# Patient Record
Sex: Female | Born: 1955 | Race: White | Hispanic: No | Smoking: Never smoker
Health system: Southern US, Community
[De-identification: ages and names within clinical notes are randomized; demographics above are authoritative.]

## PROBLEM LIST (undated history)

## (undated) ENCOUNTER — Emergency Department (HOSPITAL_BASED_OUTPATIENT_CLINIC_OR_DEPARTMENT_OTHER): Payer: Medicare Other

## (undated) DIAGNOSIS — E079 Disorder of thyroid, unspecified: Secondary | ICD-10-CM

## (undated) DIAGNOSIS — J189 Pneumonia, unspecified organism: Secondary | ICD-10-CM

## (undated) DIAGNOSIS — K219 Gastro-esophageal reflux disease without esophagitis: Secondary | ICD-10-CM

## (undated) DIAGNOSIS — K859 Acute pancreatitis without necrosis or infection, unspecified: Secondary | ICD-10-CM

## (undated) HISTORY — DX: Disorder of thyroid, unspecified: E07.9

## (undated) HISTORY — PX: COLONOSCOPY: SHX174

## (undated) HISTORY — DX: Pneumonia, unspecified organism: J18.9

## (undated) HISTORY — DX: Gastro-esophageal reflux disease without esophagitis: K21.9

## (undated) HISTORY — DX: Acute pancreatitis without necrosis or infection, unspecified: K85.90

---

## 1995-03-14 HISTORY — PX: STAPEDECTOMY: SHX2435

## 2005-08-17 ENCOUNTER — Encounter: Admission: RE | Admit: 2005-08-17 | Discharge: 2005-08-17 | Payer: Self-pay | Admitting: Nephrology

## 2010-01-10 ENCOUNTER — Encounter (HOSPITAL_COMMUNITY)
Admission: RE | Admit: 2010-01-10 | Discharge: 2010-03-12 | Payer: Self-pay | Source: Home / Self Care | Attending: Internal Medicine | Admitting: Internal Medicine

## 2010-04-03 ENCOUNTER — Encounter: Payer: Self-pay | Admitting: Nephrology

## 2010-06-01 ENCOUNTER — Ambulatory Visit (HOSPITAL_BASED_OUTPATIENT_CLINIC_OR_DEPARTMENT_OTHER)
Admission: RE | Admit: 2010-06-01 | Discharge: 2010-06-01 | Disposition: A | Discharge status: Home / Self Care | Payer: Medicare Other | Source: Ambulatory Visit | Attending: Orthopedic Surgery | Admitting: Orthopedic Surgery

## 2010-06-01 DIAGNOSIS — S60459A Superficial foreign body of unspecified finger, initial encounter: Secondary | ICD-10-CM | POA: Insufficient documentation

## 2010-06-01 DIAGNOSIS — Y92009 Unspecified place in unspecified non-institutional (private) residence as the place of occurrence of the external cause: Secondary | ICD-10-CM | POA: Insufficient documentation

## 2010-06-01 DIAGNOSIS — IMO0002 Reserved for concepts with insufficient information to code with codable children: Secondary | ICD-10-CM | POA: Insufficient documentation

## 2010-06-10 NOTE — Op Note (Signed)
NAMEJOHANN, [name]                    ACCOUNT NO.:  [id number]  MEDICAL RECORD NO.:  [id number]           PATIENT TYPE:  LOCATION:                                 FACILITY:  PHYSICIAN:  Betha Loa, MD             DATE OF BIRTH:  DATE OF PROCEDURE:  06/01/2010 DATE OF DISCHARGE:                              OPERATIVE REPORT   PREOPERATIVE DIAGNOSIS:  Left ring finger retained foreign body.  POSTOPERATIVE DIAGNOSIS:  Left ring finger retained foreign body.  PROCEDURE:  Removal of foreign body, left ring finger.  SURGEON:  Betha Loa, MD  ASSISTANT:  None.  ANESTHESIA:  Digital block with 1.5% plain Carbocaine.  COMPLICATIONS:  None.  SPECIMENS:  None.  DISPOSITION:  Stable.  INDICATIONS:  Cristina [name] is a 55 year old right-hand-dominant female who one and half weeks ago was doing work in the garden and felt a piece of something going to the pad of her left ring finger.  Her daughter tried to remove it but was unsuccessful.  She presented to her primary care physician yesterday.  She was started on antibiotics.  She is referred to me for removal of the foreign body.  I saw her this morning.  She noted tenderness in the fingertip over where she felt the foreign body was.  She had no fever, chills, or night sweats.  There is no erythema, no swelling in the pad.  We discussed the nature of foreign bodies. This was nonradiopaque on radiographs.  She wished to have it removed. Risks, benefits, and alternatives to procedure were discussed including risk of blood loss, infection, damage to nerves, vessels, tendons, ligaments, and bone, failure of procedure, need for additional procedures, complications with wound healing, continued pain, and retained foreign body.  She voiced understanding these risks and elected to proceed.  PROCEDURE NOTE:  She was identified preoperatively by myself in the preoperative holding.  Risks, benefits, and alternatives of procedure were discussed and  she wished to proceed.  She was transferred to minor procedure room and placed on the table in supine position with left upper extremity on arm board.  Digital block was performed with 1.5% plain Carbocaine.  The was adequate to give total digital anesthesia. The hand was prepped in normal sterile orthopedic fashion.  Surgical pause was performed between surgeons and operating room staff prior to digital block.  All were in agreement as to the patient, procedure, and site of procedure.  Penrose drain was used as a tourniquet was up for less than 10 minutes.  A small incision was made at the volar pad of the finger over the area where she felt the foreign body.  Spreading technique was used.  Foreign body was easily identified in the subcutaneous tissues.  It appeared to be a small thorn.  This was removed in its entirety.  The area was explored.  No other foreign bodies were noted.  There was no purulence, no evidence of infection. The wound was copiously irrigated with 300 mL of sterile saline.  The wound was then closed with  5-0 nylon in a horizontal mattress fashion. It was dressed with sterile Xeroform and 2 x 2 gauze and wrapped with Kling and Coban dressing.  The Penrose drain was removed.  The patient tolerated the procedure well.  She was discharged in stable condition. I gave her Percocet 5/325 one to two p.o. q.6 h p.r.n. pain, dispensed #40.  She will see me back in the office in 1 week for postprocedure follow up.  If she has any problems prior to that, she will let me know.     Betha Loa, MD     KK/MEDQ  D:  06/01/2010  T:  06/02/2010  Job:  [id number]  Electronically Signed by Betha Loa  on 06/10/2010 02:26:39 PM

## 2013-10-29 ENCOUNTER — Other Ambulatory Visit: Payer: Self-pay | Admitting: Gastroenterology

## 2013-10-29 DIAGNOSIS — R1032 Left lower quadrant pain: Secondary | ICD-10-CM

## 2013-10-30 ENCOUNTER — Encounter (INDEPENDENT_AMBULATORY_CARE_PROVIDER_SITE_OTHER): Payer: Self-pay

## 2013-10-30 ENCOUNTER — Ambulatory Visit
Admission: RE | Admit: 2013-10-30 | Discharge: 2013-10-30 | Disposition: A | Discharge status: Home / Self Care | Payer: Medicare Other | Source: Ambulatory Visit | Attending: Gastroenterology | Admitting: Gastroenterology

## 2013-10-30 DIAGNOSIS — R1032 Left lower quadrant pain: Secondary | ICD-10-CM

## 2013-10-30 MED ORDER — IOHEXOL 300 MG/ML  SOLN
100.0000 mL | Freq: Once | INTRAMUSCULAR | Status: AC | PRN
  Administered 2013-10-30: 100 mL via INTRAVENOUS

## 2014-03-13 HISTORY — PX: COLON SURGERY: SHX602

## 2015-10-19 ENCOUNTER — Other Ambulatory Visit: Payer: Self-pay | Admitting: Orthopedic Surgery

## 2015-10-19 DIAGNOSIS — M25561 Pain in right knee: Secondary | ICD-10-CM

## 2015-10-20 ENCOUNTER — Ambulatory Visit
Admission: RE | Admit: 2015-10-20 | Discharge: 2015-10-20 | Disposition: A | Discharge status: Home / Self Care | Payer: Medicare Other | Source: Ambulatory Visit | Attending: Orthopedic Surgery | Admitting: Orthopedic Surgery

## 2015-10-20 DIAGNOSIS — M25561 Pain in right knee: Secondary | ICD-10-CM

## 2016-08-29 ENCOUNTER — Other Ambulatory Visit: Payer: Self-pay | Admitting: Orthopedic Surgery

## 2016-08-29 DIAGNOSIS — M25561 Pain in right knee: Principal | ICD-10-CM

## 2016-08-29 DIAGNOSIS — G8929 Other chronic pain: Secondary | ICD-10-CM

## 2016-09-05 ENCOUNTER — Ambulatory Visit
Admission: RE | Admit: 2016-09-05 | Discharge: 2016-09-05 | Disposition: A | Discharge status: Home / Self Care | Payer: Medicare Other | Source: Ambulatory Visit | Attending: Orthopedic Surgery | Admitting: Orthopedic Surgery

## 2016-09-05 DIAGNOSIS — M25561 Pain in right knee: Principal | ICD-10-CM

## 2016-09-05 DIAGNOSIS — G8929 Other chronic pain: Secondary | ICD-10-CM

## 2016-09-05 MED ORDER — IOPAMIDOL (ISOVUE-M 200) INJECTION 41%
30.0000 mL | Freq: Once | INTRAMUSCULAR | Status: AC
  Administered 2016-09-05: 30 mL via INTRA_ARTICULAR

## 2016-10-19 ENCOUNTER — Other Ambulatory Visit: Payer: Self-pay | Admitting: Nephrology

## 2016-10-19 ENCOUNTER — Ambulatory Visit
Admission: RE | Admit: 2016-10-19 | Discharge: 2016-10-19 | Disposition: A | Discharge status: Home / Self Care | Payer: Medicare Other | Source: Ambulatory Visit | Attending: Nephrology | Admitting: Nephrology

## 2016-10-19 DIAGNOSIS — N281 Cyst of kidney, acquired: Secondary | ICD-10-CM

## 2019-11-28 ENCOUNTER — Other Ambulatory Visit: Payer: Self-pay | Admitting: Nephrology

## 2019-11-28 DIAGNOSIS — N281 Cyst of kidney, acquired: Secondary | ICD-10-CM

## 2019-12-04 ENCOUNTER — Ambulatory Visit
Admission: RE | Admit: 2019-12-04 | Discharge: 2019-12-04 | Disposition: A | Discharge status: Home / Self Care | Payer: Medicare Other | Source: Ambulatory Visit | Attending: Nephrology | Admitting: Nephrology

## 2019-12-04 ENCOUNTER — Other Ambulatory Visit: Payer: Self-pay

## 2019-12-04 DIAGNOSIS — N281 Cyst of kidney, acquired: Secondary | ICD-10-CM

## 2019-12-04 MED ORDER — IOPAMIDOL (ISOVUE-300) INJECTION 61%
100.0000 mL | Freq: Once | INTRAVENOUS | Status: AC | PRN
  Administered 2019-12-04: 100 mL via INTRAVENOUS

## 2020-10-26 IMAGING — CT CT ABD-PEL WO/W CM
3 of 7 series · 13 of 32 positions shown, 18 images · IV contrast (APPLIED)
Comparison: Renal ultrasound, 10/19/2016, CT abdomen pelvis,
10/30/2013

CLINICAL DATA: Follow-up renal mass

EXAM:
CT ABDOMEN AND PELVIS WITHOUT AND WITH CONTRAST
TECHNIQUE: Multidetector CT imaging of the abdomen and pelvis was performed
following the standard protocol before and following the bolus
administration of intravenous contrast.
CONTRAST:  100mL AZMZOT-0LL IOPAMIDOL (AZMZOT-0LL) INJECTION 61%

[Series 2: abd w/(date) · axial · 0.64mm/px · z∈[-211,-103]mm · 3 of 74 slices shown]
[im 19/74  soft-tissue]
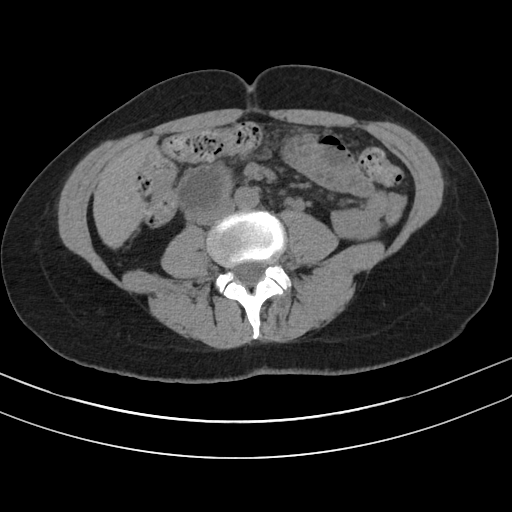
[im 37/74  soft-tissue]
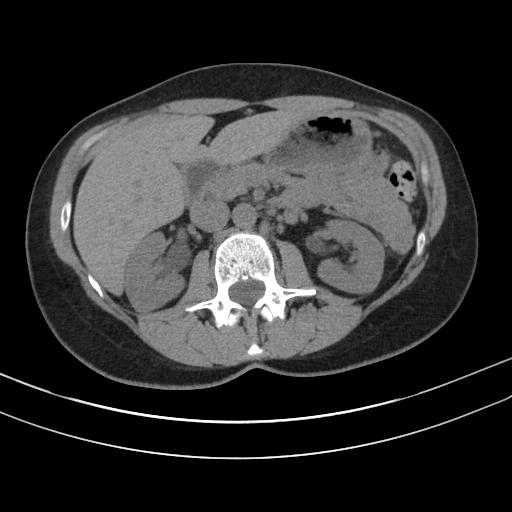
[im 55/74  soft-tissue]
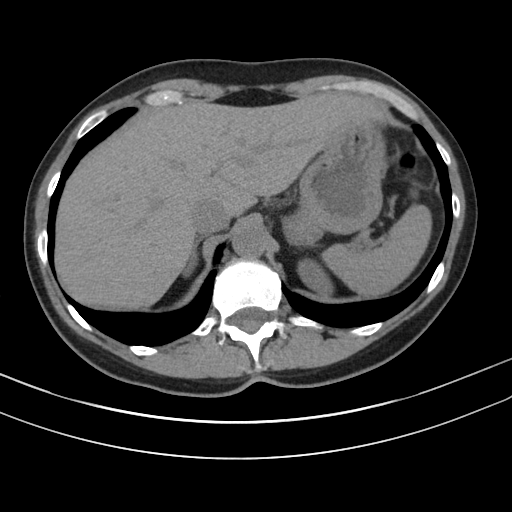

[Series 3: arterial · axial · arterial · 0.64mm/px · z∈[-211,-157]mm · 2 of 74 slices shown]
[im 19/74  soft-tissue]
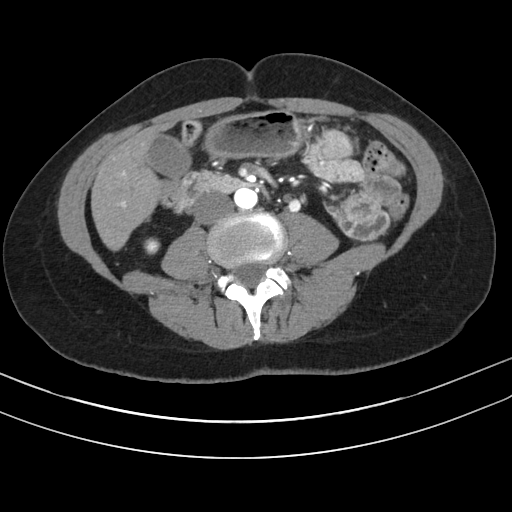
[im 37/74  soft-tissue]
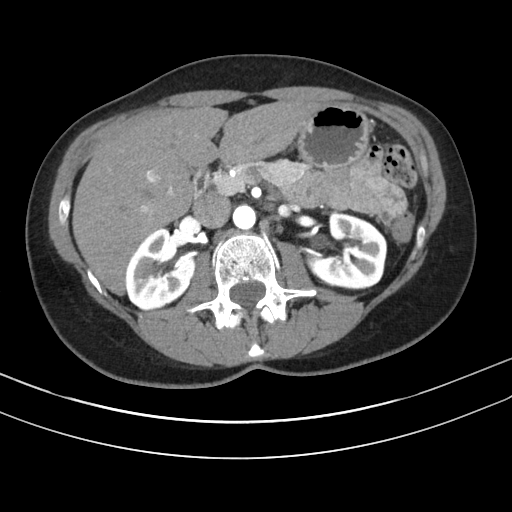

[Series 5: nephrographic · axial · 0.81mm/px · z∈[-433,-97]mm · 8 of 146 slices shown, 13 images]
[im 17/146  soft-tissue]
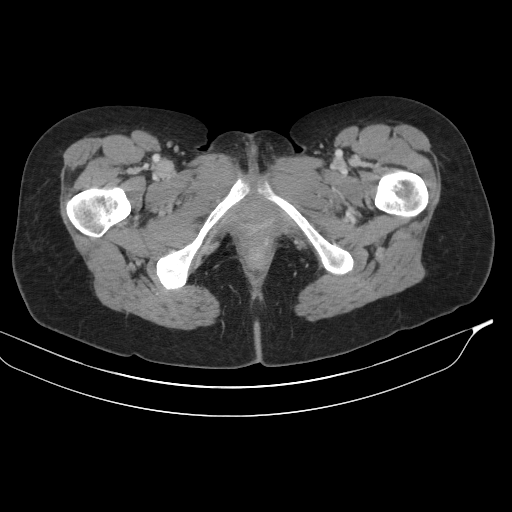
[im 17/146  bone]
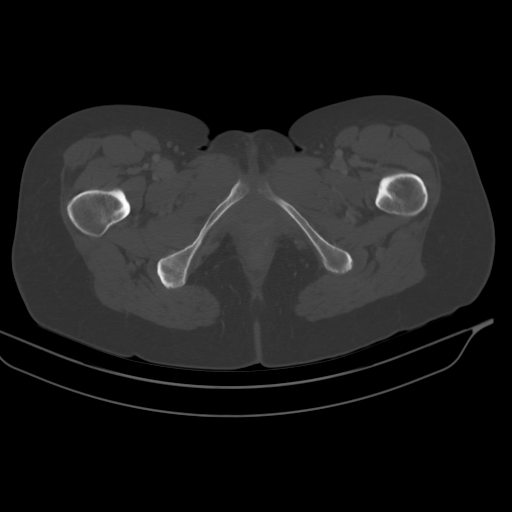
[im 33/146  soft-tissue]
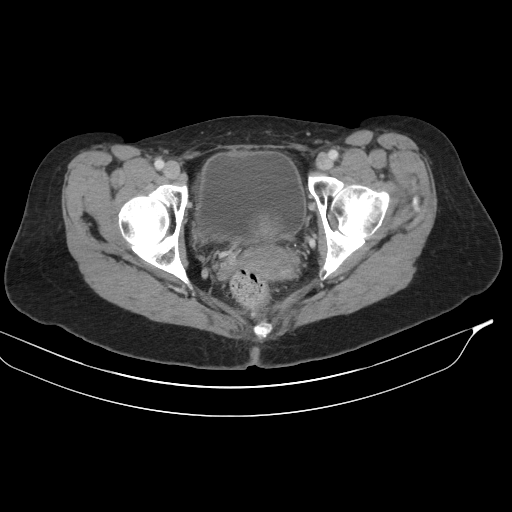
[im 49/146  soft-tissue]
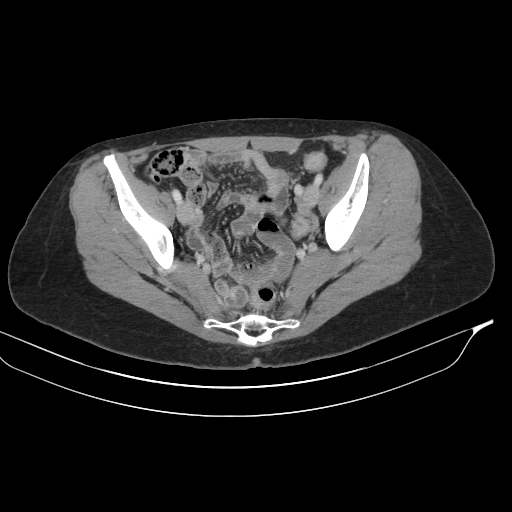
[im 65/146  soft-tissue]
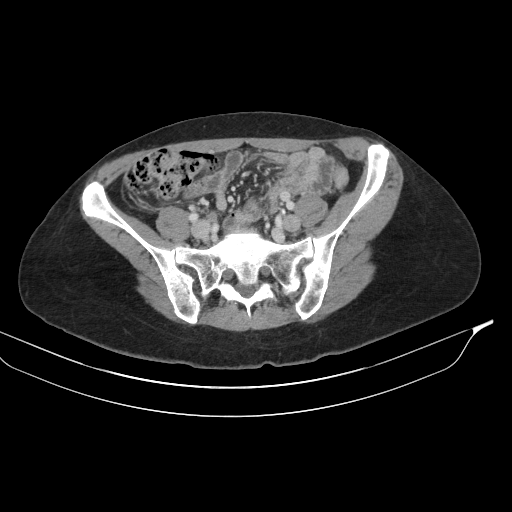
[im 81/146  soft-tissue]
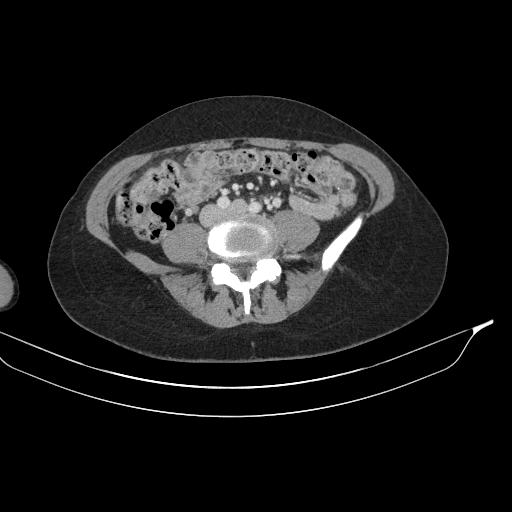
[im 81/146  lung]
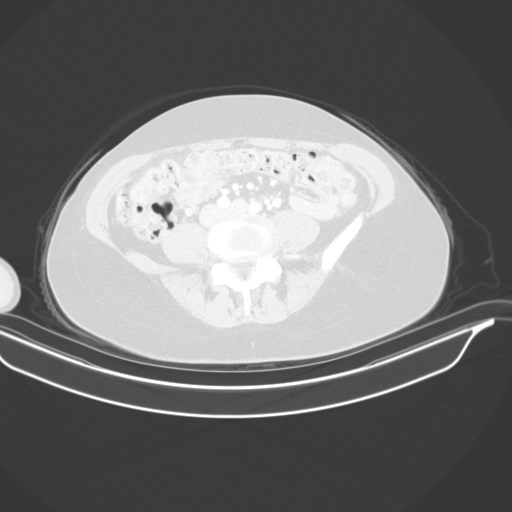
[im 97/146  soft-tissue]
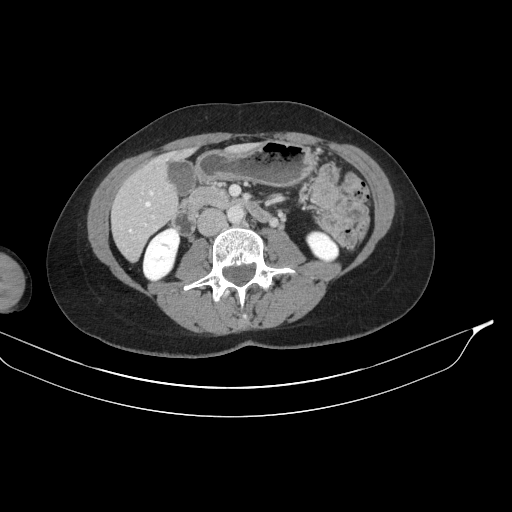
[im 97/146  lung]
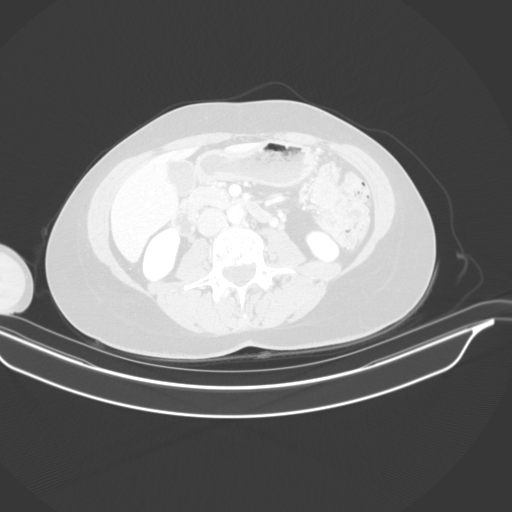
[im 113/146  soft-tissue]
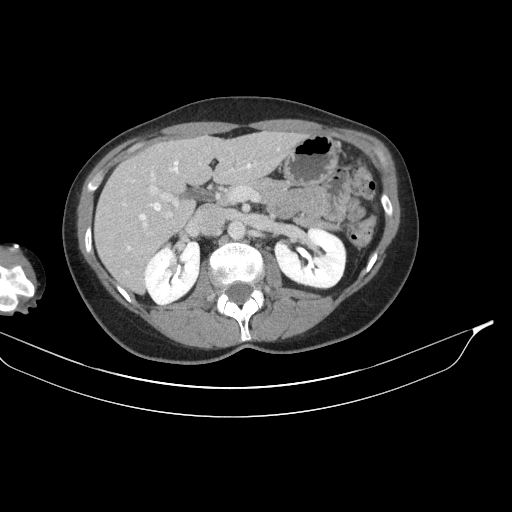
[im 113/146  lung]
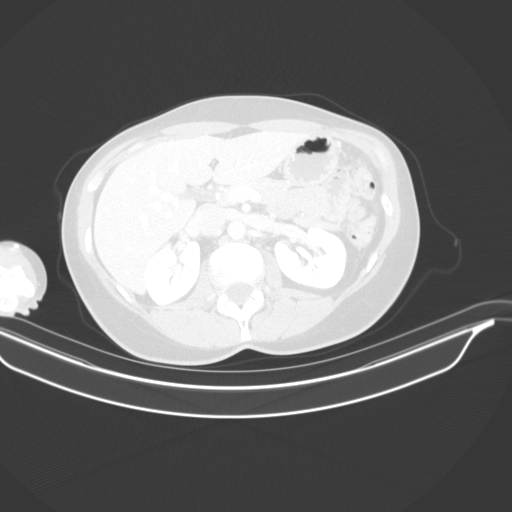
[im 129/146  soft-tissue]
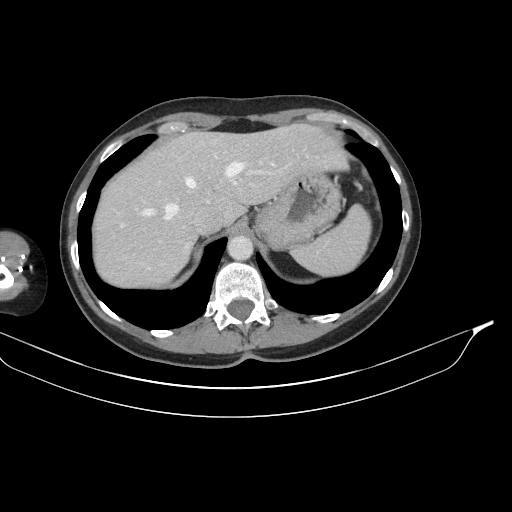
[im 129/146  lung]
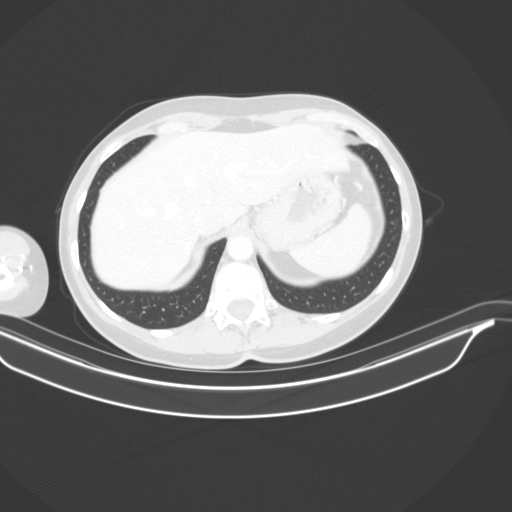

[13 of 32 positions shown; findings below may reference images not displayed]

FINDINGS: Lower chest: No acute abnormality.

Hepatobiliary: No solid liver abnormality is seen. No gallstones,
gallbladder wall thickening, or biliary dilatation.

Pancreas: Unremarkable. No pancreatic ductal dilatation or
surrounding inflammatory changes.

Spleen: Normal in size without significant abnormality.

Adrenals/Urinary Tract: Adrenal glands are unremarkable. There is an
unchanged, partially exophytic macroscopic fat containing mass of
the peripheral superior pole of the right kidney measuring 1.1 x
cm, not significantly changed compared to prior examinations (series
3, image 26). Kidneys are normal, without renal calculi, suspicious
solid lesion, or hydronephrosis. Bladder is unremarkable.

Stomach/Bowel: Stomach is within normal limits. Appendix appears
normal. No evidence of bowel wall thickening, distention, or
inflammatory changes.

Vascular/Lymphatic: No significant vascular findings are present. No
enlarged abdominal or pelvic lymph nodes.

Reproductive: There are large bilateral uterine and adnexal varices
with enlargement of the left ovarian vein, measuring up to 0.9 cm
near the confluence with the left renal vein.

Other: No abdominal wall hernia or abnormality. No abdominopelvic
ascites.

Musculoskeletal: No acute or significant osseous findings.
IMPRESSION: 1. There is an unchanged, partially exophytic macroscopic fat
containing mass of the peripheral superior pole of the right kidney
measuring 1.1 x 1.0 cm, not significantly changed compared to prior
examinations. This is consistent with a benign angiomyolipoma. No
further routine follow-up is required.
2. There are large bilateral uterine and adnexal varices with
enlargement of the left ovarian vein, measuring up to 0.9 cm near
the confluence with the left renal vein. Findings are consistent
with pelvic congestion if clinically referable signs and symptoms
are present.

## 2021-06-22 ENCOUNTER — Other Ambulatory Visit (HOSPITAL_BASED_OUTPATIENT_CLINIC_OR_DEPARTMENT_OTHER): Payer: Self-pay | Admitting: Family Medicine

## 2021-06-22 DIAGNOSIS — R1012 Left upper quadrant pain: Secondary | ICD-10-CM

## 2021-06-23 ENCOUNTER — Other Ambulatory Visit: Payer: Self-pay

## 2021-06-23 ENCOUNTER — Emergency Department (HOSPITAL_BASED_OUTPATIENT_CLINIC_OR_DEPARTMENT_OTHER)
Admission: RE | Admit: 2021-06-23 | Discharge: 2021-06-23 | Disposition: A | Discharge status: Home / Self Care | Payer: Medicare Other | Source: Ambulatory Visit | Attending: Family Medicine | Admitting: Family Medicine

## 2021-06-23 ENCOUNTER — Encounter (HOSPITAL_BASED_OUTPATIENT_CLINIC_OR_DEPARTMENT_OTHER): Payer: Self-pay

## 2021-06-23 ENCOUNTER — Emergency Department (HOSPITAL_BASED_OUTPATIENT_CLINIC_OR_DEPARTMENT_OTHER)
Admission: EM | Admit: 2021-06-23 | Discharge: 2021-06-23 | Disposition: A | Discharge status: Home / Self Care | Payer: Medicare Other | Attending: Emergency Medicine | Admitting: Emergency Medicine

## 2021-06-23 ENCOUNTER — Emergency Department (HOSPITAL_BASED_OUTPATIENT_CLINIC_OR_DEPARTMENT_OTHER): Payer: Medicare Other | Admitting: Radiology

## 2021-06-23 ENCOUNTER — Encounter (HOSPITAL_BASED_OUTPATIENT_CLINIC_OR_DEPARTMENT_OTHER): Payer: Self-pay | Admitting: Emergency Medicine

## 2021-06-23 DIAGNOSIS — R748 Abnormal levels of other serum enzymes: Secondary | ICD-10-CM | POA: Insufficient documentation

## 2021-06-23 DIAGNOSIS — R1012 Left upper quadrant pain: Secondary | ICD-10-CM

## 2021-06-23 DIAGNOSIS — K859 Acute pancreatitis without necrosis or infection, unspecified: Secondary | ICD-10-CM | POA: Diagnosis not present

## 2021-06-23 DIAGNOSIS — E039 Hypothyroidism, unspecified: Secondary | ICD-10-CM | POA: Insufficient documentation

## 2021-06-23 LAB — CBC
HCT: 40.1 % (ref 36.0–46.0)
Hemoglobin: 13.2 g/dL (ref 12.0–15.0)
MCH: 30.9 pg (ref 26.0–34.0)
MCHC: 32.9 g/dL (ref 30.0–36.0)
MCV: 93.9 fL (ref 80.0–100.0)
Platelets: 274 10*3/uL (ref 150–400)
RBC: 4.27 MIL/uL (ref 3.87–5.11)
RDW: 12.1 % (ref 11.5–15.5)
WBC: 6.9 10*3/uL (ref 4.0–10.5)
nRBC: 0 % (ref 0.0–0.2)

## 2021-06-23 LAB — BASIC METABOLIC PANEL
Anion gap: 7 (ref 5–15)
BUN: 7 mg/dL — ABNORMAL LOW (ref 8–23)
CO2: 30 mmol/L (ref 22–32)
Calcium: 10 mg/dL (ref 8.9–10.3)
Chloride: 101 mmol/L (ref 98–111)
Creatinine, Ser: 0.87 mg/dL (ref 0.44–1.00)
GFR, Estimated: >60 mL/min (ref >60)
Glucose, Bld: 93 mg/dL (ref 70–99)
Potassium: 3.9 mmol/L (ref 3.5–5.1)
Sodium: 138 mmol/L (ref 135–145)

## 2021-06-23 LAB — TROPONIN I (HIGH SENSITIVITY): Troponin I (High Sensitivity): <2 ng/L (ref <18)

## 2021-06-23 LAB — LIPASE, BLOOD: Lipase: 53 U/L — ABNORMAL HIGH (ref 11–51)

## 2021-06-23 MED ORDER — OXYCODONE-ACETAMINOPHEN 5-325 MG PO TABS
1.0000 | ORAL_TABLET | Freq: Four times a day (QID) | ORAL | 0 refills | Status: DC | PRN
Start: 2021-06-23 — End: 2021-06-23

## 2021-06-23 MED ORDER — SODIUM CHLORIDE 0.9 % IV BOLUS
1000.0000 mL | Freq: Once | INTRAVENOUS | Status: AC
Start: 2021-06-23 — End: 2021-06-23
  Administered 2021-06-23: 1000 mL via INTRAVENOUS

## 2021-06-23 MED ORDER — OXYCODONE-ACETAMINOPHEN 5-325 MG PO TABS
1.0000 | ORAL_TABLET | Freq: Four times a day (QID) | ORAL | 0 refills | Status: DC | PRN
Start: 2021-06-23 — End: 2022-11-08

## 2021-06-23 MED ORDER — IOHEXOL 300 MG/ML  SOLN
100.0000 mL | Freq: Once | INTRAMUSCULAR | Status: AC | PRN
  Administered 2021-06-23: 80 mL via INTRAVENOUS

## 2021-06-23 NOTE — Discharge Instructions (Addendum)
You are seen in the emergency department today for abdominal pain and concern for pancreatitis. ? ?As we discussed your lipase level is already coming down.  Your CT scan had showed concerning findings for possibly pancreatitis versus inflammation of your intestines.  With an elevated lipase result, we will treat this as though it is pancreatitis.  I think that you will require some further investigation as to the underlying etiology of the symptoms. ? ?We normally treat pancreatitis with pain medication and bowel rest.  Like you to continue doing clear fluids for at least the next 24 to 48 hours, and then he can move to full liquids after that.  From there you can transition to a bland diet as tolerated. ? ?Continue to monitor how you're doing and return to the ER for new or worsening symptoms.  ?

## 2021-06-23 NOTE — ED Notes (Signed)
Pt without complaint. Pt stated she is just waiting on lipase lab to result. ?

## 2021-06-23 NOTE — ED Provider Notes (Signed)
?[facility] ?Provider Note ? ? ?CSN: [id number] ?Arrival date & time: 06/23/21  1352 ? ?  ? ?History ? ?Chief Complaint  ?Patient presents with  ? Pancreatitis  ? ? ?Cristina [name] is a 66 y.o. female who presents emergency department with left upper quadrant pain for several days.  Patient initially thought that her symptoms are related to gas, and then went to her primary care doctor and had an elevated lipase level, so she was sent for CT scan.  The CT scan showed possible pancreatitis, and she was referred to the emergency department.  She states she was given Zofran, took that last night and her nausea subsided.  Patient reports overall her pain has improved, but she still sometimes has pain in the left upper quadrant with inspiration.  No shortness of breath or cough.  There is been taking in fluids, but has not felt up to eating. ? ?HPI ? ?  ? ?Home Medications ?Prior to Admission medications   ?Medication Sig Start Date End Date Taking? Authorizing Provider  ?oxyCODONE-acetaminophen (PERCOCET/ROXICET) 5-325 MG tablet Take 1 tablet by mouth every 6 (six) hours as needed for severe pain. 06/23/21  Yes Jeevan Kalla T, PA-C  ?   ? ?Allergies    ?Epinephrine and Lidocaine   ? ?Review of Systems   ?Review of Systems  ?Constitutional:  Negative for fever.  ?Respiratory:  Negative for shortness of breath.   ?Cardiovascular:  Negative for chest pain.  ?Gastrointestinal:  Positive for abdominal pain and nausea. Negative for blood in stool, constipation, diarrhea and vomiting.  ?Genitourinary:  Negative for dysuria and flank pain.  ?All other systems reviewed and are negative. ? ?Physical Exam ?Updated Vital Signs ?BP 106/61 (BP Location: Right Arm)   Pulse 76   Temp 98.3 ?F (36.8 ?C) (Oral)   Resp 18   Ht 5\' 5"  (1.651 m)   Wt 59 kg   SpO2 100%   BMI 21.63 kg/m?  ?Physical Exam ?Vitals and nursing note reviewed.  ?Constitutional:   ?   Appearance: Normal appearance.  ?HENT:  ?   Head:  Normocephalic and atraumatic.  ?Eyes:  ?   Conjunctiva/sclera: Conjunctivae normal.  ?Cardiovascular:  ?   Rate and Rhythm: Normal rate and regular rhythm.  ?Pulmonary:  ?   Effort: Pulmonary effort is normal. No respiratory distress.  ?   Breath sounds: Normal breath sounds.  ?Abdominal:  ?   General: There is no distension.  ?   Palpations: Abdomen is soft.  ?   Tenderness: There is abdominal tenderness in the epigastric area and left upper quadrant. There is no right CVA tenderness, left CVA tenderness, guarding or rebound.  ?Skin: ?   General: Skin is warm and dry.  ?Neurological:  ?   General: No focal deficit present.  ?   Mental Status: She is alert.  ? ? ?ED Results / Procedures / Treatments   ?Labs ?(all labs ordered are listed, but only abnormal results are displayed) ?Labs Reviewed  ?BASIC METABOLIC PANEL - Abnormal; Notable for the following components:  ?    Result Value  ? BUN 7 (*)   ? All other components within normal limits  ?LIPASE, BLOOD - Abnormal; Notable for the following components:  ? Lipase 53 (*)   ? All other components within normal limits  ?CBC  ?TROPONIN I (HIGH SENSITIVITY)  ? ? ?EKG ?None ? ?Radiology ?CT ABDOMEN PELVIS W CONTRAST ? ?Result Date: 06/23/2021 ?CLINICAL DATA:  Left upper  quadrant pain EXAM: CT ABDOMEN AND PELVIS WITH CONTRAST TECHNIQUE: Multidetector CT imaging of the abdomen and pelvis was performed using the standard protocol following bolus administration of intravenous contrast. RADIATION DOSE REDUCTION: This exam was performed according to the departmental dose-optimization program which includes automated exposure control, adjustment of the mA and/or kV according to patient size and/or use of iterative reconstruction technique. CONTRAST:  48mL OMNIPAQUE IOHEXOL 300 MG/ML  SOLN COMPARISON:  12/04/2019 FINDINGS: Lower chest: No acute abnormality. Hepatobiliary: Lateral right inferior liver subcapsular hyperenhancing focus compatible with a small hemangioma as before,  measuring 9 mm, image 22/2. No other significant hepatic abnormality. No other hepatic abnormality or biliary dilatation pattern. Gallbladder unremarkable. Common bile duct nondilated. Hepatic and portal veins are patent. Pancreas: In the left upper quadrant, about the pancreas tail, splenic hilum and splenic flexure of the colon there is minimal strandy edema in the adjacent fat, images 17 through 23/2. This is new compared to the prior study of 12/04/2019. Appearance is nonspecific. Considerations include mild distal pancreatitis, mild localized colitis/diverticulitis of the splenic flexure of the colon, or adjacent epiploic appendagitis along the colon in this region. No evidence of free air, perforation, or abscess. Spleen: Normal in size without focal abnormality. Adrenals/Urinary Tract: Normal adrenal glands. Stable benign 1 cm angiomyolipoma of the right kidney posterior cortex, image 14/2. No other renal abnormality, obstruction or hydroureter. No hydronephrosis or obstructive uropathy. Ureters are symmetric and decompressed. Bladder unremarkable. Stomach/Bowel: Negative for bowel obstruction, significant dilatation, ileus, or free air. Normal appendix demonstrated. See above comment regarding the left upper quadrant findings. Vascular/Lymphatic: No significant vascular findings are present. No enlarged abdominal or pelvic lymph nodes. Reproductive: Uterus and bilateral adnexa are unremarkable. Trace pelvic free fluid noted. Other: No abdominal wall hernia or abnormality. No abdominopelvic ascites. Musculoskeletal: No acute or significant osseous findings. IMPRESSION: Minimal strandy edema in the left upper quadrant as above, nonspecific for mild acute distal pancreatitis, colitis or mild diverticulitis of the splenic flexure of the colon, or localized left upper quadrant epiploic appendagitis. No other complicating feature including perforation, obstruction, fluid collection, or abscess. Electronically  Signed   By: Jerilynn Mages.  Shick M.D.   On: 06/23/2021 11:22   ? ?Procedures ?Procedures  ? ? ?Medications Ordered in ED ?Medications  ?sodium chloride 0.9 % bolus 1,000 mL ( Intravenous Stopped 06/23/21 1844)  ? ? ?ED Course/ Medical Decision Making/ A&P ?  ?                        ?Medical Decision Making ?Amount and/or Complexity of Data Reviewed ?Labs: ordered. ? ?This patient presents to the ED for concern of abdominal pain, this involves an extensive number of treatment options, and is a complaint that carries with it a high risk of complications and morbidity. The emergent differential diagnosis prior to evaluation includes, but is not limited to,  AAA, mesenteric ischemia, appendicitis, diverticulitis, DKA, gastritis/gastroenteritis, nephrolithiasis, pancreatitis, peritonitis, constipation, UTI, SBO/LBO, biliary disease, IBD/IBS, PUD, ovarian torsion, PID. ? ?This is not an exhaustive differential.  ? ?Past Medical History / Co-morbidities / Social History: ?Hypothyroidism, vitamin D deficiency ? ?Additional history: ?Additional history obtained from chart review. External records from outside source obtained and reviewed including patient's most recent PCP visit, as well as imaging ordered from outside facility.  Labs reviewed from yesterday showed a lipase of 335. ? ?Physical Exam: ?Physical exam performed. The pertinent findings include: Patient is afebrile, not tachycardic, no acute distress.  Abdomen is  soft, with some tenderness to palpation in the epigastric and left upper quadrant region, without guarding. ? ?Lab Tests: ?I ordered, and personally interpreted labs.  The pertinent results include: No leukocytosis, normal hemoglobin.  Electrolytes within normal limits.  Troponin less than 2.  Lipase 53. ?  ?Medications: ?I ordered medication including IV fluids.  I offered the patient antinausea medicine and pain medicine, and she states she has not needed this at this time.  Reevaluation of the patient after  these medicines showed that the patient improved. I have reviewed the patients home medicines and have made adjustments as needed. ? ?Disposition: ?After consideration of the diagnostic results and the patients resp

## 2021-06-23 NOTE — ED Triage Notes (Signed)
Pt sent from her PCP related to elevated Lipase levels and CT scan that indicated possible pancreatitis. Pt reports of LUQ pain.Pt was given Zofran last night and reports nasuea has still subsided.  ?

## 2021-07-06 ENCOUNTER — Other Ambulatory Visit: Payer: Self-pay | Admitting: Gastroenterology

## 2021-09-30 ENCOUNTER — Encounter (HOSPITAL_COMMUNITY): Payer: Self-pay

## 2021-09-30 ENCOUNTER — Ambulatory Visit (HOSPITAL_COMMUNITY): Admit: 2021-09-30 | Payer: Medicare Other | Admitting: Gastroenterology

## 2021-09-30 SURGERY — UPPER ESOPHAGEAL ENDOSCOPIC ULTRASOUND (EUS)
Anesthesia: Monitor Anesthesia Care

## 2022-05-16 IMAGING — CT CT ABD-PELV W/ CM
2 of 5 series · 16 of 46 positions shown, 18 images · IV contrast (APPLIED)
Comparison: 12/04/2019

CLINICAL DATA: Left upper quadrant pain

EXAM:
CT ABDOMEN AND PELVIS WITH CONTRAST
TECHNIQUE: Multidetector CT imaging of the abdomen and pelvis was performed
using the standard protocol following bolus administration of
intravenous contrast.

[Series 2: abd pel w · axial · 0.76mm/px · z∈[-334,+16]mm · 13 of 80 slices shown, 15 images]
[im 5/80  soft-tissue]
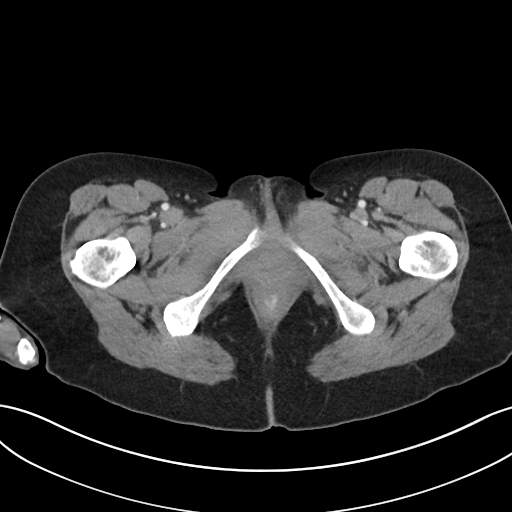
[im 5/80  bone]
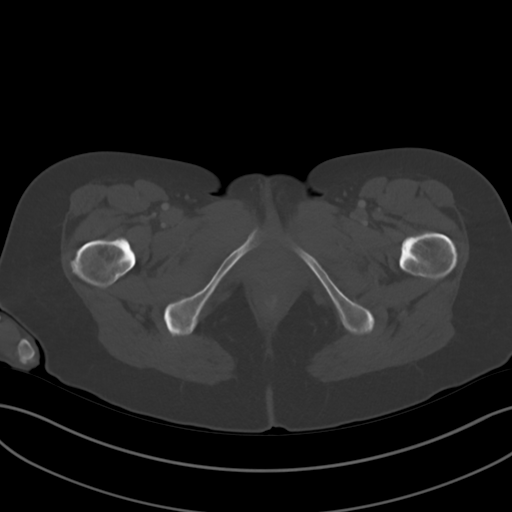
[im 9/80  soft-tissue]
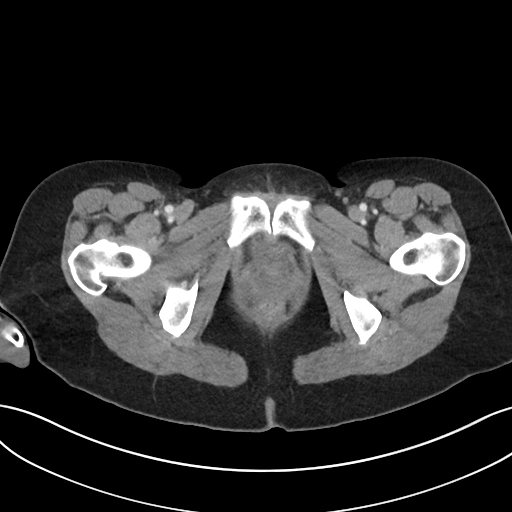
[im 18/80  soft-tissue]
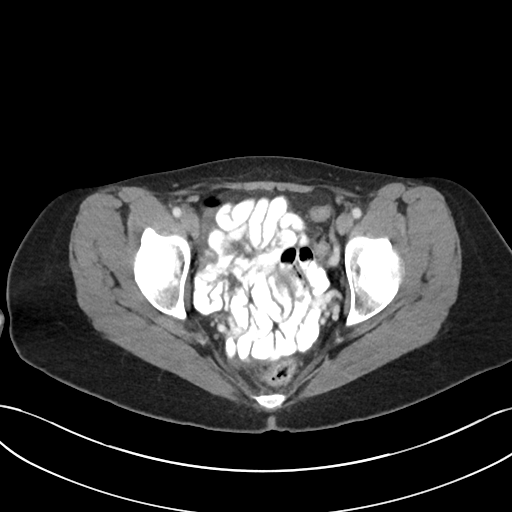
[im 22/80  soft-tissue]
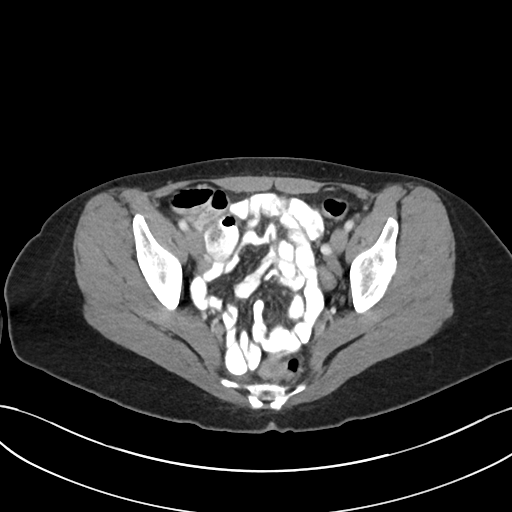
[im 27/80  soft-tissue]
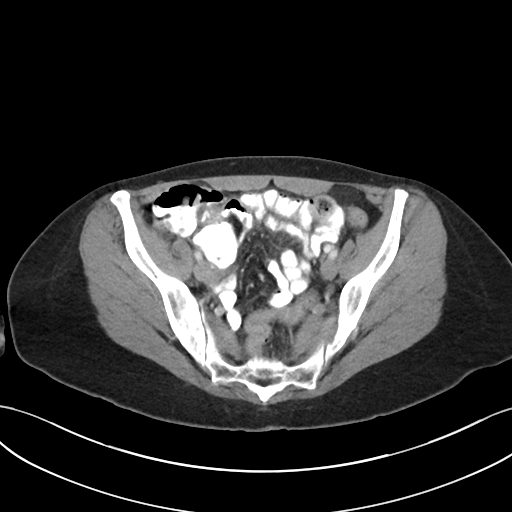
[im 36/80  soft-tissue]
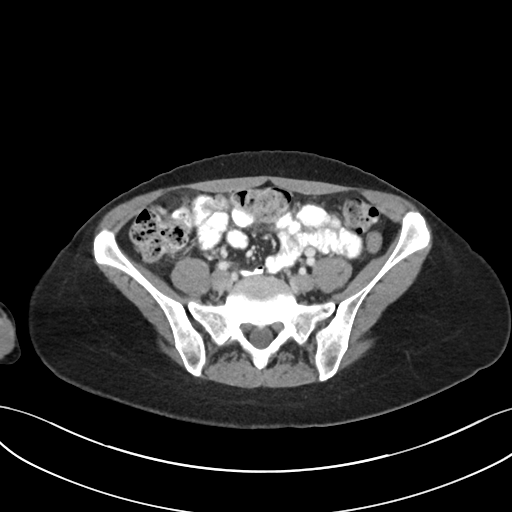
[im 40/80  soft-tissue]
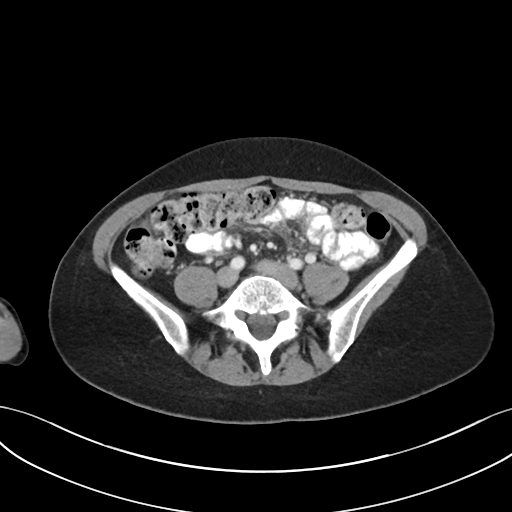
[im 44/80  soft-tissue]
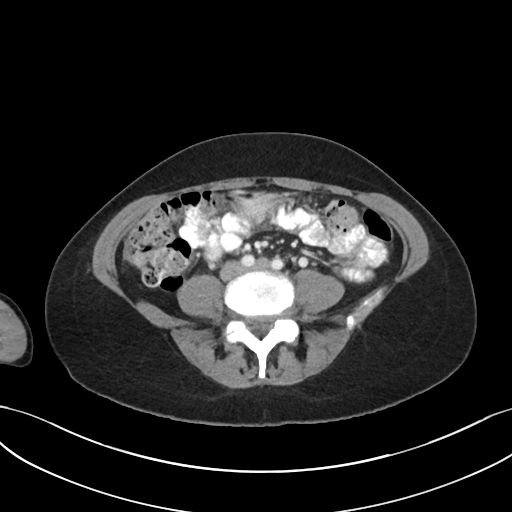
[im 53/80  soft-tissue]
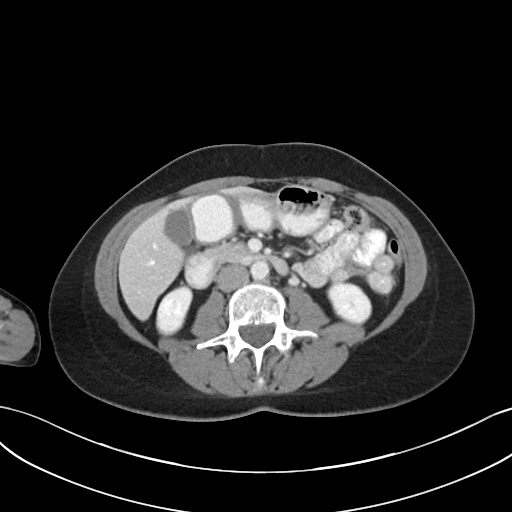
[im 53/80  bone]
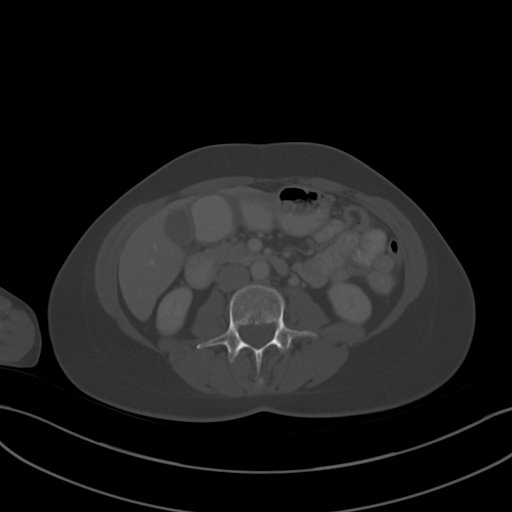
[im 58/80  soft-tissue]
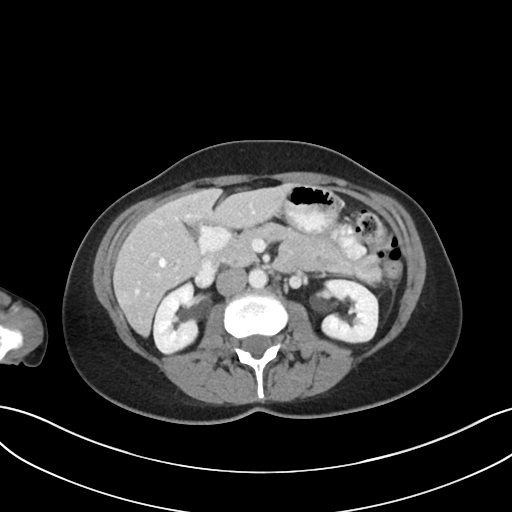
[im 62/80  soft-tissue]
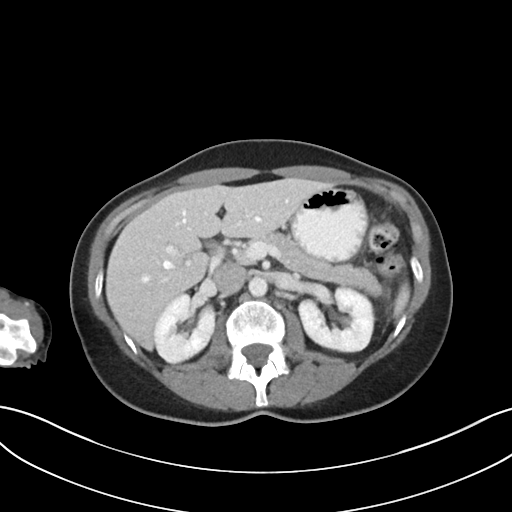
[im 71/80  soft-tissue]
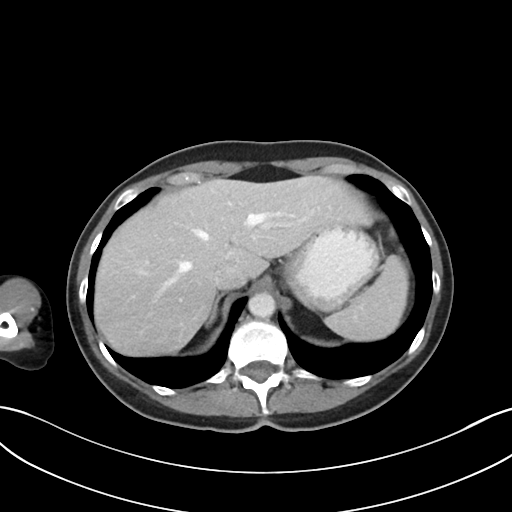
[im 75/80  soft-tissue]
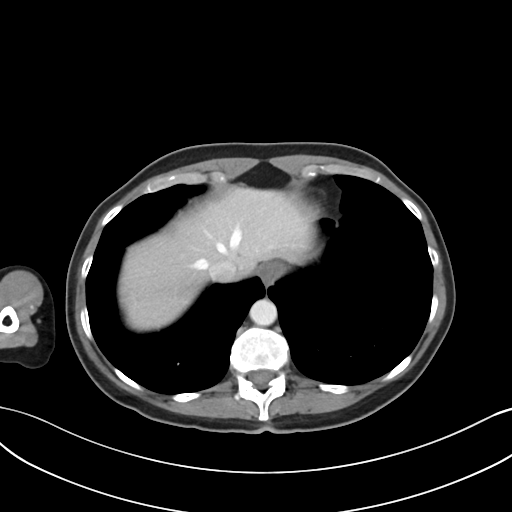

[Series 5: coronal · coronal · 0.72mm/px · 3 of 76 slices shown]
[im 26/76  soft-tissue]
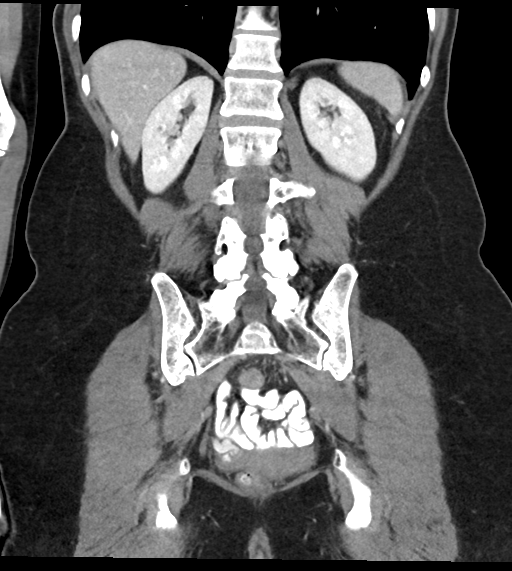
[im 34/76  soft-tissue]
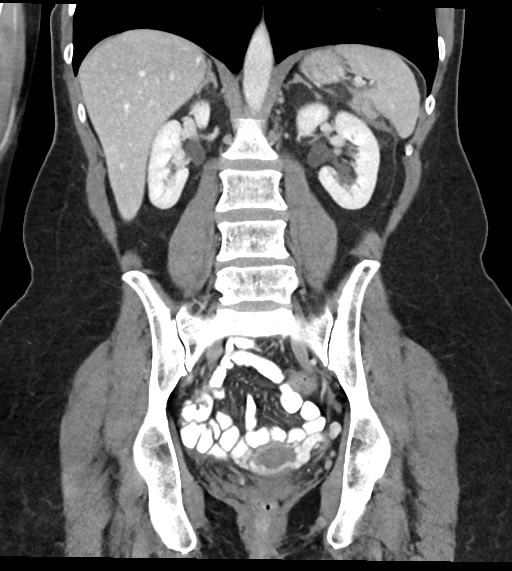
[im 42/76  soft-tissue]
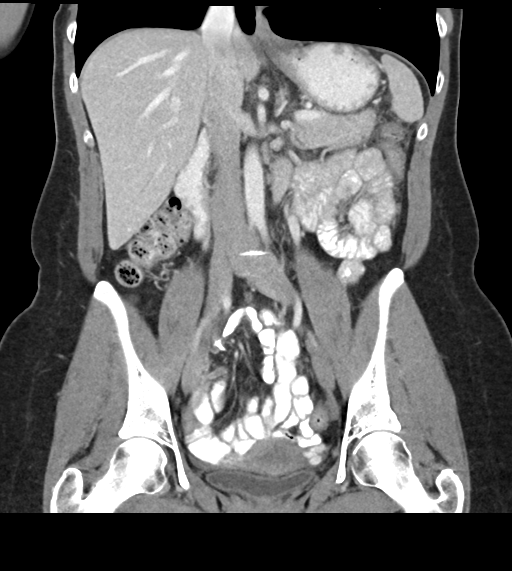

[16 of 46 positions shown; findings below may reference images not displayed]

RADIATION DOSE REDUCTION: This exam was performed according to the
departmental dose-optimization program which includes automated
exposure control, adjustment of the mA and/or kV according to
patient size and/or use of iterative reconstruction technique.

CONTRAST:  80mL OMNIPAQUE IOHEXOL 300 MG/ML  SOLN
FINDINGS: Lower chest: No acute abnormality.

Hepatobiliary: Lateral right inferior liver subcapsular
hyperenhancing focus compatible with a small hemangioma as before,
measuring 9 mm, image [DATE]. No other significant hepatic
abnormality. No other hepatic abnormality or biliary dilatation
pattern. Gallbladder unremarkable. Common bile duct nondilated.
Hepatic and portal veins are patent.

Pancreas: In the left upper quadrant, about the pancreas tail,
splenic hilum and splenic flexure of the colon there is minimal
strandy edema in the adjacent fat, images 17 through [DATE]. This is
new compared to the prior study of 12/04/2019. Appearance is
nonspecific. Considerations include mild distal pancreatitis, mild
localized colitis/diverticulitis of the splenic flexure of the
colon, or adjacent epiploic appendagitis along the colon in this
region.

No evidence of free air, perforation, or abscess.

Spleen: Normal in size without focal abnormality.

Adrenals/Urinary Tract: Normal adrenal glands.

Stable benign 1 cm angiomyolipoma of the right kidney posterior
cortex, image [DATE]. No other renal abnormality, obstruction or
hydroureter. No hydronephrosis or obstructive uropathy. Ureters are
symmetric and decompressed. Bladder unremarkable.

Stomach/Bowel: Negative for bowel obstruction, significant
dilatation, ileus, or free air. Normal appendix demonstrated. See
above comment regarding the left upper quadrant findings.

Vascular/Lymphatic: No significant vascular findings are present. No
enlarged abdominal or pelvic lymph nodes.

Reproductive: Uterus and bilateral adnexa are unremarkable. Trace
pelvic free fluid noted.

Other: No abdominal wall hernia or abnormality. No abdominopelvic
ascites.

Musculoskeletal: No acute or significant osseous findings.
IMPRESSION: Minimal strandy edema in the left upper quadrant as above,
nonspecific for mild acute distal pancreatitis, colitis or mild
diverticulitis of the splenic flexure of the colon, or localized
left upper quadrant epiploic appendagitis.

No other complicating feature including perforation, obstruction,
fluid collection, or abscess.

## 2022-07-13 ENCOUNTER — Encounter: Payer: Self-pay | Admitting: Gastroenterology

## 2022-11-08 ENCOUNTER — Other Ambulatory Visit (INDEPENDENT_AMBULATORY_CARE_PROVIDER_SITE_OTHER): Payer: Medicare Other

## 2022-11-08 ENCOUNTER — Encounter: Payer: Self-pay | Admitting: Gastroenterology

## 2022-11-08 ENCOUNTER — Ambulatory Visit (INDEPENDENT_AMBULATORY_CARE_PROVIDER_SITE_OTHER): Payer: Medicare Other | Admitting: Gastroenterology

## 2022-11-08 VITALS — BP 90/64 | HR 90 | Ht 65.0 in | Wt 125.1 lb

## 2022-11-08 DIAGNOSIS — Z8619 Personal history of other infectious and parasitic diseases: Secondary | ICD-10-CM

## 2022-11-08 DIAGNOSIS — K861 Other chronic pancreatitis: Secondary | ICD-10-CM

## 2022-11-08 DIAGNOSIS — Z1211 Encounter for screening for malignant neoplasm of colon: Secondary | ICD-10-CM

## 2022-11-08 DIAGNOSIS — R1012 Left upper quadrant pain: Secondary | ICD-10-CM | POA: Diagnosis not present

## 2022-11-08 LAB — COMPREHENSIVE METABOLIC PANEL
ALT: 15 U/L (ref 0–35)
AST: 23 U/L (ref 0–37)
Albumin: 4 g/dL (ref 3.5–5.2)
Alkaline Phosphatase: 56 U/L (ref 39–117)
BUN: 18 mg/dL (ref 6–23)
CO2: 32 mEq/L (ref 19–32)
Calcium: 9.3 mg/dL (ref 8.4–10.5)
Chloride: 102 mEq/L (ref 96–112)
Creatinine, Ser: 0.82 mg/dL (ref 0.40–1.20)
GFR: 75.34 mL/min (ref >60.00)
Glucose, Bld: 74 mg/dL (ref 70–99)
Potassium: 4.5 mEq/L (ref 3.5–5.1)
Sodium: 139 mEq/L (ref 135–145)
Total Bilirubin: 0.5 mg/dL (ref 0.2–1.2)
Total Protein: 6.8 g/dL (ref 6.0–8.3)

## 2022-11-08 LAB — CBC WITH DIFFERENTIAL/PLATELET
Basophils Absolute: 0 10*3/uL (ref 0.0–0.1)
Basophils Relative: 0.7 % (ref 0.0–3.0)
Eosinophils Absolute: 0.1 10*3/uL (ref 0.0–0.7)
Eosinophils Relative: 1.5 % (ref 0.0–5.0)
HCT: 41.7 % (ref 36.0–46.0)
Hemoglobin: 13.5 g/dL (ref 12.0–15.0)
Lymphocytes Relative: 35.1 % (ref 12.0–46.0)
Lymphs Abs: 1.5 10*3/uL (ref 0.7–4.0)
MCHC: 32.3 g/dL (ref 30.0–36.0)
MCV: 96.1 fl (ref 78.0–100.0)
Monocytes Absolute: 0.4 10*3/uL (ref 0.1–1.0)
Monocytes Relative: 8.9 % (ref 3.0–12.0)
Neutro Abs: 2.3 10*3/uL (ref 1.4–7.7)
Neutrophils Relative %: 53.8 % (ref 43.0–77.0)
Platelets: 287 10*3/uL (ref 150.0–400.0)
RBC: 4.34 Mil/uL (ref 3.87–5.11)
RDW: 13.3 % (ref 11.5–15.5)
WBC: 4.2 10*3/uL (ref 4.0–10.5)

## 2022-11-08 LAB — LIPASE: Lipase: 45 U/L (ref 11.0–59.0)

## 2022-11-08 LAB — B12 AND FOLATE PANEL
Folate: >24.2 ng/mL (ref >5.9)
Vitamin B-12: 565 pg/mL (ref 211–911)

## 2022-11-08 LAB — C-REACTIVE PROTEIN: CRP: <1 mg/dL (ref 0.5–20.0)

## 2022-11-08 LAB — AMYLASE: Amylase: 76 U/L (ref 27–131)

## 2022-11-08 NOTE — Patient Instructions (Signed)
You will be contacted by [facility] Scheduling in the next 2 days to arrange a CT Abdomen/Pelvis.  The number on your caller ID will be [phone], please answer when they call.  If you have not heard from them in 2 days please call [phone] to schedule.     Your provider has requested that you go to the basement level for lab work before leaving today. Press "B" on the elevator. The lab is located at the first door on the left as you exit the elevator.   We will try to obtain your records from Dr Arlyce Dice 2016, colonoscopy from 10 years ago  Due to recent changes in healthcare laws, you may see the results of your imaging and laboratory studies on MyChart before your provider has had a chance to review them.  We understand that in some cases there may be results that are confusing or concerning to you. Not all laboratory results come back in the same time frame and the provider may be waiting for multiple results in order to interpret others.  Please give [place] 48 hours in order for your provider to thoroughly review all the results before contacting the office for clarification of your results.    _______________________________________________________  If your blood pressure at your visit was 140/90 or greater, please contact your primary care physician to follow up on this.  _______________________________________________________  If you are age 65 or older, your body mass index should be between 23-30. Your Body mass index is 20.82 kg/m. If this is out of the aforementioned range listed, please consider follow up with your Primary Care Provider.  If you are age 71 or younger, your body mass index should be between 19-25. Your Body mass index is 20.82 kg/m. If this is out of the aformentioned range listed, please consider follow up with your Primary Care Provider.   ________________________________________________________  The [facility] providers would like to encourage you to  use [facility] to communicate with providers for non-urgent requests or questions.  Due to long hold times on the telephone, sending your provider a message by [facility] may be a faster and more efficient way to get a response.  Please allow 48 business hours for a response.  Please remember that this is for non-urgent requests.  _______________________________________________________   I appreciate the  opportunity to care for you  Thank You   Marsa Aris , MD

## 2022-11-08 NOTE — Progress Notes (Signed)
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 Denies any nausea, vomiting, melena or bright red blood per rectum  Review of system positive for intermittent abdominal bloating and dyspepsia symptoms.  She has occasional left upper quadrant abdominal pain She was treated for giardiasis when she was overseas [place], not sure if she was tested for eradication [place] for 6 [place]/2 years, came back to [place] in 2022  In April 2023 developed severe LUQ abd pain, was seen in ER for acute pancreatitis, diagnosed with idiopathic pancreatitis  ?surgery for twisted intestine in 2016, had surgical resection  Last colonoscopy over 10 years ago in [place]  Lipase 06/23/21: 53  CT abd & pelvis with contrast 06/23/2021 Minimal strandy edema in the left upper quadrant as above, nonspecific for mild acute distal pancreatitis, colitis or mild diverticulitis of the splenic flexure of the colon, or localized left upper quadrant epiploic appendagitis.   No other complicating feature including perforation, obstruction, fluid collection, or abscess.  CT abd & pelvis 12/04/2019 1. There is an unchanged, partially exophytic macroscopic fat containing mass of the peripheral superior pole of the right kidney measuring 1.1 x 1.0 cm, not significantly changed compared to prior examinations. This is consistent with a benign angiomyolipoma. No further routine follow-up is required. 2. 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 Findings are consistent with pelvic congestion if clinically referable signs and symptoms are present.     Outpatient Encounter Medications as of 11/08/2022  Medication Sig   alclomethasone (ACLOVATE) 0.05 % cream Apply 1 Application topically as needed.   Cholecalciferol (VITAMIN D3) 10 MCG (400 UNIT) tablet Take 400 Units by mouth daily.   levothyroxine (SYNTHROID) 75 MCG tablet Take 75 mcg by mouth daily.   Multiple Vitamin (MULTIVITAMIN) capsule Take 1 capsule by mouth daily.   Probiotic Product (ADVANCED PROBIOTIC-14) CAPS 1 capsule daily.   [DISCONTINUED] oxyCODONE-acetaminophen (PERCOCET/ROXICET) 5-325 MG tablet Take 1 tablet by mouth every 6 (six) hours as needed for severe pain.   No facility-administered encounter medications on file as of 11/08/2022.    Allergies as of 11/08/2022 - Review Complete 11/08/2022  Allergen Reaction Noted   Epinephrine Anaphylaxis 06/23/2021   Lidocaine Anaphylaxis 06/23/2021    Past Medical History:  Diagnosis Date   Pancreatitis    Pneumonia    Thyroid disease     History reviewed. No pertinent surgical history.  No family history on file.  Social History   Socioeconomic History   Marital status: Divorced    Spouse name: Not on file   Number of children: Not on file   Years of education: Not on file   Highest education level: Not on file  Occupational History   Not on file  Tobacco Use   Smoking status: Not on file   Smokeless tobacco: Not on file  Substance and Sexual Activity   Alcohol use: Not on file   Drug use: Not on  file   Sexual activity: Not on file  Other Topics Concern   Not on file  Social History Narrative   Not on file   Social Determinants of Health   Financial Resource Strain: Low Risk  (02/18/2019)   [facility] from [facility] visits prior to 05/13/2022., [facility] visits prior to 05/13/2022.   Overall Financial  Resource Strain (CARDIA)    Difficulty of Paying Living Expenses: Not hard at all  Food Insecurity: No Food Insecurity (02/18/2019)   Received from [facility] visits prior to 05/13/2022., [facility] visits prior to 05/13/2022.   Hunger Vital Sign    Worried About Running Out of Food in the Last Year: Never true    Ran Out of Food in the Last Year: Never true  Transportation Needs: No Transportation Needs (02/18/2019)   Received from [facility] visits prior to 05/13/2022., [facility] visits prior to 05/13/2022.   PRAPARE - Administrator, Civil Service (Medical): No    Lack of Transportation (Non-Medical): No  Physical Activity: Insufficiently Active (02/18/2019)   Received from [facility] visits prior to 05/13/2022., [facility] visits prior to 05/13/2022.   Exercise Vital Sign    Days of Exercise per Week: 4 days    Minutes of Exercise per Session: 30 min  Stress: Not on file  Social Connections: Unknown (02/18/2019)   [facility] visits prior to 05/13/2022., [facility] visits prior to 05/13/2022.   Social Advertising account executive [NHANES]    Frequency of Communication with Friends and Family: More than three times a week    Frequency of Social Gatherings with Friends and Family: Patient refused    Attends Religious Services: More than 4 times per year    Active Member of [employer] or Organizations: Yes    Attends Engineer, structural: More than 4 times per year    Marital Status: Patient refused  Intimate Partner Violence: Not on file      Review of systems: All other review of systems negative except as mentioned in the HPI.   Physical Exam: Vitals:   11/08/22 1017  BP: 90/64  Pulse: 90   Body mass index is 20.82 kg/m. Gen:      No acute distress HEENT:  sclera  anicteric Abd:      soft, non-tender; no palpable masses, no distension Ext:    No edema Neuro: alert and oriented x 3 Psych: normal mood and affect  Data Reviewed:  Reviewed labs, radiology imaging, old records and pertinent past GI work up   Assessment and Plan/Recommendations:  67 year old very pleasant female with history of idiopathic pancreatitis in 2023 is here to establish GI care Intermittent left upper quadrant abdominal pain, dyspepsia and abdominal bloating Follow-up amylase, lipase, CBC, CMP and CRP Check ANA, IgG4 to exclude autoimmune pancreatitis No history of gallstones or biliary sludge based on imaging No history of excessive alcohol use Will obtain CT abdomen and pelvis with pancreas protocol to exclude any neoplastic lesions   History of giardiasis: Check stool Giardia antigen to confirm eradication  Family history positive for colon cancer in her brother at age 30 Last colonoscopy >10 years ago in [place] per patient, was normal, is past due for surveillance exam Patient is reluctant to schedule colonoscopy during this visit Will  obtain records of previous GI workup and also surgical records from history of intestinal surgery in 2016  The patient was provided an opportunity to ask questions and all were answered. The patient agreed with the plan and demonstrated an understanding of the instructions.  Iona Beard , MD    CC: Richmond Campbell., PA-C

## 2022-11-09 ENCOUNTER — Encounter: Payer: Self-pay | Admitting: Gastroenterology

## 2022-11-10 ENCOUNTER — Ambulatory Visit: Payer: Medicare Other

## 2022-11-10 DIAGNOSIS — Z8619 Personal history of other infectious and parasitic diseases: Secondary | ICD-10-CM

## 2022-11-10 DIAGNOSIS — Z1211 Encounter for screening for malignant neoplasm of colon: Secondary | ICD-10-CM

## 2022-11-10 DIAGNOSIS — K861 Other chronic pancreatitis: Secondary | ICD-10-CM

## 2022-11-10 DIAGNOSIS — R1012 Left upper quadrant pain: Secondary | ICD-10-CM

## 2022-11-10 LAB — IGG 4: IgG, Subclass 4: 27 mg/dL (ref 2–96)

## 2022-11-12 LAB — ANTI-NUCLEAR AB-TITER (ANA TITER): ANA Titer 1: 1:80 {titer} — ABNORMAL HIGH

## 2022-11-12 LAB — ANA: Anti Nuclear Antibody (ANA): POSITIVE — AB

## 2022-11-15 LAB — OVA AND PARASITE EXAMINATION
CONCENTRATE RESULT:: NONE SEEN
MICRO NUMBER:: 15405833
SPECIMEN QUALITY:: ADEQUATE
TRICHROME RESULT:: NONE SEEN

## 2022-11-15 LAB — GIARDIA ANTIGEN
MICRO NUMBER:: 15405832
RESULT:: NOT DETECTED
SPECIMEN QUALITY:: ADEQUATE

## 2022-11-17 ENCOUNTER — Ambulatory Visit (HOSPITAL_BASED_OUTPATIENT_CLINIC_OR_DEPARTMENT_OTHER)
Admission: RE | Admit: 2022-11-17 | Discharge: 2022-11-17 | Disposition: A | Discharge status: Home / Self Care | Payer: Medicare Other | Source: Ambulatory Visit | Attending: Gastroenterology | Admitting: Gastroenterology

## 2022-11-17 DIAGNOSIS — Z1211 Encounter for screening for malignant neoplasm of colon: Secondary | ICD-10-CM | POA: Diagnosis present

## 2022-11-17 DIAGNOSIS — R1012 Left upper quadrant pain: Secondary | ICD-10-CM | POA: Diagnosis present

## 2022-11-17 DIAGNOSIS — Z8619 Personal history of other infectious and parasitic diseases: Secondary | ICD-10-CM | POA: Diagnosis present

## 2022-11-17 DIAGNOSIS — K861 Other chronic pancreatitis: Secondary | ICD-10-CM | POA: Diagnosis present

## 2022-11-17 MED ORDER — IOHEXOL 300 MG/ML  SOLN
100.0000 mL | Freq: Once | INTRAMUSCULAR | Status: AC | PRN
  Administered 2022-11-17: 75 mL via INTRAVENOUS

## 2022-11-20 ENCOUNTER — Encounter: Payer: Self-pay | Admitting: Gastroenterology

## 2022-12-05 ENCOUNTER — Telehealth: Payer: Self-pay | Admitting: Gastroenterology

## 2022-12-05 NOTE — Telephone Encounter (Signed)
Inbound call from patient f/u on results. Please advise.  Thank you

## 2022-12-08 NOTE — Telephone Encounter (Signed)
I sent a message through MyChart regarding the results to patient informing her that overall CT and labs are unremarkable.  I am happy to discuss further if she has questions

## 2022-12-08 NOTE — Telephone Encounter (Signed)
Inbound call from patient, returning call in regards to results

## 2022-12-08 NOTE — Telephone Encounter (Signed)
Patient is doing well. She is calling to follow up on her labs. She has seen the results in My Chart. Please advise.

## 2022-12-10 ENCOUNTER — Emergency Department (HOSPITAL_BASED_OUTPATIENT_CLINIC_OR_DEPARTMENT_OTHER)
Admission: EM | Admit: 2022-12-10 | Discharge: 2022-12-10 | Disposition: A | Discharge status: Home / Self Care | Payer: Medicare Other | Attending: Emergency Medicine | Admitting: Emergency Medicine

## 2022-12-10 ENCOUNTER — Emergency Department (HOSPITAL_BASED_OUTPATIENT_CLINIC_OR_DEPARTMENT_OTHER): Payer: Medicare Other

## 2022-12-10 DIAGNOSIS — R7989 Other specified abnormal findings of blood chemistry: Secondary | ICD-10-CM | POA: Insufficient documentation

## 2022-12-10 DIAGNOSIS — K859 Acute pancreatitis without necrosis or infection, unspecified: Secondary | ICD-10-CM | POA: Diagnosis not present

## 2022-12-10 DIAGNOSIS — R1013 Epigastric pain: Secondary | ICD-10-CM | POA: Diagnosis present

## 2022-12-10 LAB — CBC
HCT: 37 % (ref 36.0–46.0)
Hemoglobin: 12.5 g/dL (ref 12.0–15.0)
MCH: 31.4 pg (ref 26.0–34.0)
MCHC: 33.8 g/dL (ref 30.0–36.0)
MCV: 93 fL (ref 80.0–100.0)
Platelets: 257 10*3/uL (ref 150–400)
RBC: 3.98 MIL/uL (ref 3.87–5.11)
RDW: 12.2 % (ref 11.5–15.5)
WBC: 4.7 10*3/uL (ref 4.0–10.5)
nRBC: 0 % (ref 0.0–0.2)

## 2022-12-10 LAB — URINALYSIS, ROUTINE W REFLEX MICROSCOPIC
Bilirubin Urine: NEGATIVE
Glucose, UA: NEGATIVE mg/dL
Hgb urine dipstick: NEGATIVE
Ketones, ur: NEGATIVE mg/dL
Leukocytes,Ua: NEGATIVE
Nitrite: NEGATIVE
Protein, ur: NEGATIVE mg/dL
Specific Gravity, Urine: 1.031 — ABNORMAL HIGH (ref 1.005–1.030)
pH: 7.5 (ref 5.0–8.0)

## 2022-12-10 LAB — COMPREHENSIVE METABOLIC PANEL
ALT: 11 U/L (ref 0–44)
AST: 19 U/L (ref 15–41)
Albumin: 4 g/dL (ref 3.5–5.0)
Alkaline Phosphatase: 46 U/L (ref 38–126)
Anion gap: 6 (ref 5–15)
BUN: 15 mg/dL (ref 8–23)
CO2: 30 mmol/L (ref 22–32)
Calcium: 9 mg/dL (ref 8.9–10.3)
Chloride: 104 mmol/L (ref 98–111)
Creatinine, Ser: 0.76 mg/dL (ref 0.44–1.00)
GFR, Estimated: >60 mL/min (ref >60)
Glucose, Bld: 95 mg/dL (ref 70–99)
Potassium: 3.9 mmol/L (ref 3.5–5.1)
Sodium: 140 mmol/L (ref 135–145)
Total Bilirubin: 0.4 mg/dL (ref 0.3–1.2)
Total Protein: 6.4 g/dL — ABNORMAL LOW (ref 6.5–8.1)

## 2022-12-10 LAB — LIPASE, BLOOD: Lipase: 65 U/L — ABNORMAL HIGH (ref 11–51)

## 2022-12-10 MED ORDER — ONDANSETRON HCL 4 MG/2ML IJ SOLN
4.0000 mg | Freq: Once | INTRAMUSCULAR | Status: AC
  Administered 2022-12-10: 4 mg via INTRAVENOUS
  Filled 2022-12-10: qty 2

## 2022-12-10 MED ORDER — MORPHINE SULFATE (PF) 2 MG/ML IV SOLN: 2.0000 mg | Freq: Once | INTRAVENOUS | Status: DC

## 2022-12-10 MED ORDER — ONDANSETRON HCL 8 MG PO TABS: 8.0000 mg | ORAL_TABLET | Freq: Three times a day (TID) | ORAL | 0 refills | Status: DC | PRN

## 2022-12-10 MED ORDER — IOHEXOL 300 MG/ML  SOLN
100.0000 mL | Freq: Once | INTRAMUSCULAR | Status: AC | PRN
  Administered 2022-12-10: 100 mL via INTRAVENOUS

## 2022-12-10 MED ORDER — LACTATED RINGERS IV BOLUS
1000.0000 mL | Freq: Once | INTRAVENOUS | Status: AC
  Administered 2022-12-10: 1000 mL via INTRAVENOUS

## 2022-12-10 NOTE — ED Provider Notes (Signed)
[facility] AT [facility] Provider Note   CSN: [id number] Arrival date & time: 12/10/22  4098     History  Chief Complaint  Patient presents with   Abdominal Pain    Cristina [name] is a 67 y.o. female.  HPI 67 year old female presents today complaining of nausea and epigastric pain that began Friday and has been ongoing and worse with eating.  She reports history of pancreatitis 1 year ago.  She states it is since resolved.  She states that she has vomited but has not been able to take much by mouth.  She has taken over-the-counter heartburn medications without relief.  States that she was seen by GI due to the episode of pancreatitis.  She does not drink alcohol, does not have significantly elevated lipids.  They never knew exactly what caused her pancreatitis.    Home Medications Prior to Admission medications   Medication Sig Start Date End Date Taking? Authorizing Provider  ondansetron (ZOFRAN) 8 MG tablet Take 1 tablet (8 mg total) by mouth every 8 (eight) hours as needed. 12/10/22  Yes Margarita Grizzle, MD  alclomethasone (ACLOVATE) 0.05 % cream Apply 1 Application topically as needed. 11/01/21   [provider]  Cholecalciferol (VITAMIN D3) 10 MCG (400 UNIT) tablet Take 400 Units by mouth daily.    [provider]  levothyroxine (SYNTHROID) 75 MCG tablet Take 75 mcg by mouth daily.    [provider]  Multiple Vitamin (MULTIVITAMIN) capsule Take 1 capsule by mouth daily.    [provider]  Probiotic Product (ADVANCED PROBIOTIC-14) CAPS 1 capsule daily.    [provider]      Allergies    Epinephrine and Lidocaine    Review of Systems   Review of Systems  Physical Exam Updated Vital Signs BP (!) 99/58   Pulse (!) 54   Temp 97.6 F (36.4 C) (Oral)   Resp 15   SpO2 100%  Physical Exam  ED Results / Procedures / Treatments   Labs (all labs ordered are listed, but only abnormal results are  displayed) Labs Reviewed  LIPASE, BLOOD - Abnormal; Notable for the following components:      Result Value   Lipase 65 (*)    All other components within normal limits  COMPREHENSIVE METABOLIC PANEL - Abnormal; Notable for the following components:   Total Protein 6.4 (*)    All other components within normal limits  URINALYSIS, ROUTINE W REFLEX MICROSCOPIC - Abnormal; Notable for the following components:   Color, Urine COLORLESS (*)    Specific Gravity, Urine 1.031 (*)    All other components within normal limits  CBC    EKG None  Radiology CT ABDOMEN PELVIS W CONTRAST  Result Date: 12/10/2022 CLINICAL DATA:  Nausea and upper left abdominal pain since Friday. History of pancreatitis. EXAM: CT ABDOMEN AND PELVIS WITH CONTRAST TECHNIQUE: Multidetector CT imaging of the abdomen and pelvis was performed using the standard protocol following bolus administration of intravenous contrast. RADIATION DOSE REDUCTION: This exam was performed according to the departmental dose-optimization program which includes automated exposure control, adjustment of the mA and/or kV according to patient size and/or use of iterative reconstruction technique. CONTRAST:  OMNIPAQUE IOHEXOL 300 MG/ML  SOLN COMPARISON:  11/17/2022 FINDINGS: Lower chest: Unremarkable. Hepatobiliary: Small cavernous hemangioma posterior right liver is stable. There is no evidence for gallstones, gallbladder wall thickening, or pericholecystic fluid. No intrahepatic or extrahepatic biliary dilation. Pancreas: No focal mass lesion. No dilatation of the  main duct. No intraparenchymal cyst. No peripancreatic edema. Spleen: No splenomegaly. No suspicious focal mass lesion. Adrenals/Urinary Tract: No adrenal nodule or mass. Stable small angiomyolipoma upper pole right kidney. No followup imaging is recommended. Left kidney unremarkable. No evidence for hydroureter. The urinary bladder appears normal for the degree of distention.  Stomach/Bowel: Stomach is unremarkable. No gastric wall thickening. No evidence of outlet obstruction. Duodenum is normally positioned as is the ligament of Treitz. No small bowel wall thickening. No small bowel dilatation. The terminal ileum is normal. The appendix is normal. No gross colonic mass. No colonic wall thickening. Vascular/Lymphatic: No abdominal aortic aneurysm. No abdominal aortic atherosclerotic calcification. There is no gastrohepatic or hepatoduodenal ligament lymphadenopathy. No retroperitoneal or mesenteric lymphadenopathy. No pelvic sidewall lymphadenopathy. Reproductive: Unremarkable. Other: Trace free fluid is seen in the cul-de-sac. Musculoskeletal: No worrisome lytic or sclerotic osseous abnormality. IMPRESSION: 1. No acute findings in the abdomen or pelvis. Specifically, no findings to explain the patient's history of left upper quadrant pain. No pancreatic or peripancreatic edema/inflammation. 2. Trace free fluid in the cul-de-sac. Nonspecific and the etiology for this trace fluid not evident on the current study. Electronically Signed   By: Kennith Center M.D.   On: 12/10/2022 12:18    Procedures Procedures    Medications Ordered in ED Medications  morphine (PF) 2 MG/ML injection 2 mg (has no administration in time range)  lactated ringers bolus 1,000 mL (0 mLs Intravenous Stopped 12/10/22 1332)  ondansetron (ZOFRAN) injection 4 mg (4 mg Intravenous Given 12/10/22 1012)  iohexol (OMNIPAQUE) 300 MG/ML solution 100 mL (100 mLs Intravenous Contrast Given 12/10/22 1126)    ED Course/ Medical Decision Making/ A&P                                 Medical Decision Making Amount and/or Complexity of Data Reviewed Labs: ordered. Radiology: ordered.  Risk Prescription drug management.   Patient with upper abdominal pain for several days.  She has had decreased p.o. intake secondary to this.  She has a history of pancreatitis with no known definitive etiology.  She was  evaluated here in the ED with labs and imaging. Right Upper Quadrant  pancreatitis Biliary colic Cholangitis Cholecystitis  Hepatitis Hepatic abscess Hepatic congestion Herpes zoster Mesenteric ischemia Perforated duodenal ulcer Pneumonia (RLL) Pulmonary embolism Pyelonephritis/nephrolithiasis   Patient evaluated with normal CBC, complete metabolic panel is normal, lipase is slightly elevated at 65 CT without acute findings to explain her symptoms.  There is a trace free fluid in the cul-de-sac Patient reassessed and she feels improved here after morphine and Zofran.  She has been taking liquids at home.  Discussed all findings with patient. She does have outpatient follow-up with GI. We have discussed diet and keeping clear liquids for the next 24 hours then gradually advancing. Will send Zofran prescription We discussed return precautions and need for follow-up and she voiced understanding       Final Clinical Impression(s) / ED Diagnoses Final diagnoses:  Acute pancreatitis without infection or necrosis, unspecified pancreatitis type    Rx / DC Orders ED Discharge Orders          Ordered    ondansetron (ZOFRAN) 8 MG tablet  Every 8 hours PRN        12/10/22 1357              Margarita Grizzle, MD 12/10/22 1358

## 2022-12-10 NOTE — Discharge Instructions (Signed)
You were evaluated in the emergency department for upper abdominal pain and probable pancreatitis today. You were evaluated with labs and CT scan. CT scan did not show any acute abnormalities Your lipase is slightly elevated at 65.  Please use clear liquids for the next 24 hours as discussed.  Zofran prescription has been called to the [facility] in the [employer] on [address]. Please use these as needed for nausea. Close call your primary care and/or your gastroenterologist tomorrow You can return to the emergency department anytime.  Please return to the emergency department if you are having worsening symptoms, worsening pain, or inability to tolerate liquids

## 2022-12-10 NOTE — ED Triage Notes (Signed)
Pt reports nausea and upper left abdominal pain since Friday evening, pain worse with eating.  +H/o pancreatitis 1 year ago. Denies vomiting/diarrhea.  Pt took OTC heartburn medication at home with no relief.

## 2022-12-11 NOTE — Telephone Encounter (Signed)
The pt was seen in the ED for pancreatitis, abd pain, and nausea. She was told to follow up in the office this week. Appt made to see Bayley 12/15/22 at 1:.30 pm.  She will call if needed prior to that time. She will follow the instructions given at the ED

## 2022-12-11 NOTE — Telephone Encounter (Signed)
Patient called to speak with a nurse regarding her ED visit, Stated she called on Friday and did not get a call regarding labs.

## 2022-12-15 ENCOUNTER — Encounter: Payer: Self-pay | Admitting: Gastroenterology

## 2022-12-15 ENCOUNTER — Ambulatory Visit (INDEPENDENT_AMBULATORY_CARE_PROVIDER_SITE_OTHER): Payer: Medicare Other | Admitting: Gastroenterology

## 2022-12-15 VITALS — BP 90/50 | HR 78 | Ht 65.0 in | Wt 124.0 lb

## 2022-12-15 DIAGNOSIS — K219 Gastro-esophageal reflux disease without esophagitis: Secondary | ICD-10-CM | POA: Diagnosis not present

## 2022-12-15 DIAGNOSIS — R1013 Epigastric pain: Secondary | ICD-10-CM

## 2022-12-15 DIAGNOSIS — Z8719 Personal history of other diseases of the digestive system: Secondary | ICD-10-CM

## 2022-12-15 MED ORDER — FAMOTIDINE 40 MG PO TABS: 40.0000 mg | ORAL_TABLET | Freq: Every day | ORAL | 1 refills | Status: DC

## 2022-12-15 MED ORDER — SUCRALFATE 1 G PO TABS: 1.0000 g | ORAL_TABLET | Freq: Two times a day (BID) | ORAL | 1 refills | Status: DC

## 2022-12-15 MED ORDER — SUCRALFATE 1 G PO TABS: 1.0000 g | ORAL_TABLET | Freq: Two times a day (BID) | ORAL | 1 refills | Status: AC

## 2022-12-15 NOTE — Progress Notes (Signed)
Chief Complaint: Hospital Follow up Primary GI MD: Dr. Lavon Paganini  HPI: 67 year old female, history of acute idiopathic pancreatitis April 2023, presents for hospital follow-up.  Last seen 11/08/2022 by Dr. Lavon Paganini.  History of acute idiopathic pancreatitis in April 2023.  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  At last visit patient was having intermittent left upper quadrant abdominal pain, dyspepsia, abdominal bloating.  Amylase, lipase, CBC, CMP, CRP, ANA, IgG4 were all normal at that time.  CT abdomen pelvis with contrast 11/28/2022 was also normal with no evidence of pancreatic ductal dilation, mass, inflammatory changes.  No gallstones.   She also has a history of Giardia and a stool study was checked to confirm eradication which came back negative.  She reports colonoscopy greater than 10 years ago in [place] which was normal per patient and she is past due for surveillance exam.  She did not schedule last office visit.  We have not yet received previous colonoscopy or surgical records.  Patient recently seen in ED 12/10/2022 for nausea and epigastric pain.  No history of alcohol.  No significantly elevated lipids and negative IgG4.  CT abdomen pelvis with contrast showed no pancreatic or peripancreatic edema/inflammation.  Lipase slightly elevated at 65 with normal CBC and CMP.  Patient reports she has had epigastric pain with eating for the last week. It is getting better. She also has associated heartburn and increased frequency of burping. Cannot take PPI or TUMs due to allergy. She tried famotidine OTC with no relief. [place] and ever since [place] the [place] she has struggled with gastro issues. Denies weight loss. Bowel habits are regular now.  She reports she is not a "doctor" person and would prefer conservative management if given the option versus invasive options.  Past Medical History:  Diagnosis  Date   Pancreatitis    Pneumonia    Thyroid disease     Past Surgical History:  Procedure Laterality Date   COLON SURGERY  2016   twisted intestins surry done in [place]   COLONOSCOPY     done in [place]   STAPEDECTOMY  1997    Current Outpatient Medications  Medication Sig Dispense Refill   alclomethasone (ACLOVATE) 0.05 % cream Apply 1 Application topically as needed.     Cholecalciferol (VITAMIN D3) 10 MCG (400 UNIT) tablet Take 400 Units by mouth daily.     levothyroxine (SYNTHROID) 75 MCG tablet Take 75 mcg by mouth daily.     Multiple Vitamin (MULTIVITAMIN) capsule Take 1 capsule by mouth daily.     ondansetron (ZOFRAN) 8 MG tablet Take 1 tablet (8 mg total) by mouth every 8 (eight) hours as needed. 20 tablet 0   Probiotic Product (ADVANCED PROBIOTIC-14) CAPS 1 capsule daily.     No current facility-administered medications for this visit.    Allergies as of 12/15/2022 - Review Complete 12/10/2022  Allergen Reaction Noted   Epinephrine Anaphylaxis 06/23/2021   Lidocaine Anaphylaxis 06/23/2021    Family History  Problem Relation Age of Onset   Diabetes Mother    Kidney cancer Mother    Colon cancer Brother 63       did have part of colon removed   Diabetes Maternal Grandmother    Kidney cancer Maternal Aunt    Esophageal cancer Neg Hx     Social History   Socioeconomic History   Marital status: Divorced    Spouse name:  Not on file   Number of children: Not on file   Years of education: Not on file   Highest education level: Not on file  Occupational History   Not on file  Tobacco Use   Smoking status: Never   Smokeless tobacco: Never  Vaping Use   Vaping status: Never Used  Substance and Sexual Activity   Alcohol use: Not Currently   Drug use: Never   Sexual activity: Not on file  Other Topics Concern   Not on file  Social History Narrative   Not on file   Social Determinants of Health   Financial Resource Strain: Low Risk  (02/18/2019)    [facility] prior to 05/13/2022., [facility] visits prior to 05/13/2022.   Overall Financial Resource Strain (CARDIA)    Difficulty of Paying Living Expenses: Not hard at all  Food Insecurity: No Food Insecurity (02/18/2019)   [facility] from [facility] visits prior to 05/13/2022., [facility] visits prior to 05/13/2022.   Hunger Vital Sign    Worried About Running Out of Food in the Last Year: Never true    Ran Out of Food in the Last Year: Never true  Transportation Needs: No Transportation Needs (02/18/2019)   Received from [facility] visits prior to 05/13/2022., [facility] visits prior to 05/13/2022.   PRAPARE - Administrator, Civil Service (Medical): No    Lack of Transportation (Non-Medical): No  Physical Activity: Insufficiently Active (02/18/2019)   Received from [facility] visits prior to 05/13/2022., [facility] visits prior to 05/13/2022.   Exercise Vital Sign    Days of Exercise per Week: 4 days    Minutes of Exercise per Session: 30 min  Stress: Not on file  Social Connections: Unknown (02/18/2019)   [facility] prior to 05/13/2022., [facility] visits prior to 05/13/2022.   Social Advertising account executive [NHANES]    Frequency of Communication with Friends and Family: More than three times a week    Frequency of Social Gatherings with Friends and Family: Patient refused    Attends Religious Services: More than 4 times per year    Active Member of [employer] or Organizations: Yes    Attends Engineer, structural: More than 4 times per year    Marital Status: Patient refused  Intimate Partner Violence: Not on file    Review of Systems:    Constitutional: No weight loss, fever, chills, weakness or fatigue HEENT:  Eyes: No change in vision               Ears, Nose, Throat:  No change in hearing or congestion Skin: No rash or itching Cardiovascular: No chest pain, chest pressure or palpitations   Respiratory: No SOB or cough Gastrointestinal: See HPI and otherwise negative Genitourinary: No dysuria or change in urinary frequency Neurological: No headache, dizziness or syncope Musculoskeletal: No new muscle or joint pain Hematologic: No bleeding or bruising Psychiatric: No history of depression or anxiety    Physical Exam:  Vital signs: There were no vitals taken for this visit.  Constitutional: NAD, Well developed, Well nourished, alert and cooperative Head:  Normocephalic and atraumatic. Eyes:   PEERL, EOMI. No icterus. Conjunctiva pink. Respiratory: Respirations even and unlabored. Lungs clear to auscultation bilaterally.   No  wheezes, crackles, or rhonchi.  Cardiovascular:  Regular rate and rhythm. No peripheral edema, cyanosis or pallor.  Gastrointestinal:  Soft, nondistended, nontender. No rebound or guarding. Normal bowel sounds. No appreciable masses or hepatomegaly. Rectal:  Not performed.  Msk:  Symmetrical without gross deformities. Without edema, no deformity or joint abnormality.  Neurologic:  Alert and  oriented x4;  grossly normal neurologically.  Skin:   Dry and intact without significant lesions or rashes. Psychiatric: Oriented to person, place and time. Demonstrates good judgement and reason without abnormal affect or behaviors.   RELEVANT LABS AND IMAGING: CBC    Component Value Date/Time   WBC 4.7 12/10/2022 1001   RBC 3.98 12/10/2022 1001   HGB 12.5 12/10/2022 1001   HCT 37.0 12/10/2022 1001   PLT 257 12/10/2022 1001   MCV 93.0 12/10/2022 1001   MCH 31.4 12/10/2022 1001   MCHC 33.8 12/10/2022 1001   RDW 12.2 12/10/2022 1001   LYMPHSABS 1.5 11/08/2022 1130   MONOABS 0.4 11/08/2022 1130   EOSABS 0.1 11/08/2022 1130   BASOSABS 0.0 11/08/2022 1130    CMP      Component Value Date/Time   NA 140 12/10/2022 1001   K 3.9 12/10/2022 1001   CL 104 12/10/2022 1001   CO2 30 12/10/2022 1001   GLUCOSE 95 12/10/2022 1001   BUN 15 12/10/2022 1001   CREATININE 0.76 12/10/2022 1001   CALCIUM 9.0 12/10/2022 1001   PROT 6.4 (L) 12/10/2022 1001   ALBUMIN 4.0 12/10/2022 1001   AST 19 12/10/2022 1001   ALT 11 12/10/2022 1001   ALKPHOS 46 12/10/2022 1001   BILITOT 0.4 12/10/2022 1001   GFRNONAA >60 12/10/2022 1001     Assessment/Plan:   Abdominal pain, epigastric Gastroesophageal reflux disease, unspecified whether esophagitis present With her recent ED visit with negative CT scan and Lipase of 65 it is likely she did not have another episode of pancreatitis. Having epigastric pain with eating, heartburn, and increased burping. Ddx includes gastritis, esophagitis, or PUD. Unable to take PPI or TUMs  -- famotidine 40mg  once daily at bedtime -- sucralfate tablet twice daily (1 hour before lunch and 1 hour before dinner) -- EGD for further evaluation and biopsies to r/o H. Pylori. She wants to schedule for November so she can trial medications. If improvement with medications she wants to cancel EGD. If so, we could do H pylori stool study --- if persistent symptoms with no improvement on medications, negative EGD, then we will proceed with HIDA  History of pancreatitis April 2023 with triglycerides 80, no gallstones, no alcohol, IgG4 negative (positive ANA with 1:80 titer). Do not suspect her recent ED visit was due to pancreatitis. -- if recurrent episode, discussed getting MRI to evaluate pancreas. 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 If concerned, we could consider EUS in the future if it continues to be an issue.   [facility] 12/15/2022, 10:35 AM  Cc: Richmond Campbell., PA-C

## 2022-12-15 NOTE — Patient Instructions (Signed)
You have been scheduled for an endoscopy. Please follow written instructions given to you at your visit today.  If you use inhalers (even only as needed), please bring them with you on the day of your procedure.  If you take any of the following medications, they will need to be adjusted prior to your procedure:   DO NOT TAKE 7 DAYS PRIOR TO TEST- Trulicity (dulaglutide) Ozempic, Wegovy (semaglutide) Mounjaro (tirzepatide) Bydureon Bcise (exanatide extended release)  DO NOT TAKE 1 DAY PRIOR TO YOUR TEST Rybelsus (semaglutide) Adlyxin (lixisenatide) Victoza (liraglutide) Byetta (exanatide) ___________________________________________________________________________   We have sent the following medications to your pharmacy for you to pick up at your convenience: Famotidine  Sucralfate   Follow up per recommendations after Endoscopy  Due to recent changes in healthcare laws, you may see the results of your imaging and laboratory studies on MyChart before your provider has had a chance to review them.  We understand that in some cases there may be results that are confusing or concerning to you. Not all laboratory results come back in the same time frame and the provider may be waiting for multiple results in order to interpret others.  Please give [place] 48 hours in order for your provider to thoroughly review all the results before contacting the office for clarification of your results.    I appreciate the  opportunity to care for you  Thank You   [facility]

## 2022-12-15 NOTE — Addendum Note (Signed)
Addended by: Marlowe Kays on: 12/15/2022 02:14 PM   Modules accepted: Orders

## 2023-01-21 ENCOUNTER — Encounter: Payer: Self-pay | Admitting: Certified Registered Nurse Anesthetist

## 2023-01-22 ENCOUNTER — Telehealth: Payer: Self-pay | Admitting: Gastroenterology

## 2023-01-22 NOTE — Telephone Encounter (Signed)
Inbound call from patient requesting a call to discuss coverage for upcoming procedure. Please advise, thank you

## 2023-01-29 ENCOUNTER — Ambulatory Visit: Payer: Medicare Other | Admitting: Gastroenterology

## 2023-01-29 ENCOUNTER — Encounter: Payer: Self-pay | Admitting: Gastroenterology

## 2023-01-29 VITALS — BP 102/60 | HR 76 | Temp 97.2°F | Resp 9 | Ht 65.0 in | Wt 124.0 lb

## 2023-01-29 DIAGNOSIS — K449 Diaphragmatic hernia without obstruction or gangrene: Secondary | ICD-10-CM

## 2023-01-29 DIAGNOSIS — K2950 Unspecified chronic gastritis without bleeding: Secondary | ICD-10-CM

## 2023-01-29 DIAGNOSIS — R1013 Epigastric pain: Secondary | ICD-10-CM

## 2023-01-29 DIAGNOSIS — K2970 Gastritis, unspecified, without bleeding: Secondary | ICD-10-CM

## 2023-01-29 MED ORDER — FAMOTIDINE 20 MG PO TABS: 40.0000 mg | ORAL_TABLET | Freq: Every day | ORAL | 4 refills | Status: DC

## 2023-01-29 MED ORDER — SODIUM CHLORIDE 0.9 % IV SOLN: 500.0000 mL | Freq: Once | INTRAVENOUS | Status: AC

## 2023-01-29 NOTE — Progress Notes (Signed)
Report given to PACU, vss 

## 2023-01-29 NOTE — Patient Instructions (Signed)
YOU HAD AN ENDOSCOPIC PROCEDURE TODAY AT THE [facility]:   Refer to the procedure report that was given to you for any specific questions about what was found during the examination.  If the procedure report does not answer your questions, please call your gastroenterologist to clarify.  If you requested that your care partner not be given the details of your procedure findings, then the procedure report has been included in a sealed envelope for you to review at your convenience later.  YOU SHOULD EXPECT: Some feelings of bloating in the abdomen. Passage of more gas than usual.  Walking can help get rid of the air that was put into your GI tract during the procedure and reduce the bloating. If you had a lower endoscopy (such as a colonoscopy or flexible sigmoidoscopy) you may notice spotting of blood in your stool or on the toilet paper. If you underwent a bowel prep for your procedure, you may not have a normal bowel movement for a few days.  Please Note:  You might notice some irritation and congestion in your nose or some drainage.  This is from the oxygen used during your procedure.  There is no need for concern and it should clear up in a day or so.  SYMPTOMS TO REPORT IMMEDIATELY:  Following upper endoscopy (EGD)  Vomiting of blood or coffee ground material  New chest pain or pain under the shoulder blades  Painful or persistently difficult swallowing  New shortness of breath  Fever of 100F or higher  Black, tarry-looking stools  For urgent or emergent issues, a gastroenterologist can be reached at any hour by calling [phone]. Do not use MyChart messaging for urgent concerns.    DIET:  We do recommend a small meal at first, but then you may proceed to your regular diet.  Drink plenty of fluids but you should avoid alcoholic beverages for 24 hours.  MEDICATIONS: Continue present medications.  Please see handouts given to you by your recovery nurse: Gastritis,  Hiatal Hernia.  FOLLOW UP: Await pathology results. Strongly recommend you schedule a surveillance colonoscopy.  Thank you for allowing [place] to provide for your healthcare needs today.  ACTIVITY:  You should plan to take it easy for the rest of today and you should NOT DRIVE or use heavy machinery until tomorrow (because of the sedation medicines used during the test).    FOLLOW UP: Our staff will call the number listed on your records the next business day following your procedure.  We will call around 7:15- 8:00 am to check on you and address any questions or concerns that you may have regarding the information given to you following your procedure. If we do not reach you, we will leave a message.     If any biopsies were taken you will be contacted by phone or by letter within the next 1-3 weeks.  Please call us at [phone] if you have not heard about the biopsies in 3 weeks.    SIGNATURES/CONFIDENTIALITY: You and/or your care partner have signed paperwork which will be entered into your electronic medical record.  These signatures attest to the fact that that the information above on your After Visit Summary has been reviewed and is understood.  Full responsibility of the confidentiality of this discharge information lies with you and/or your care-partner.

## 2023-01-29 NOTE — Progress Notes (Signed)
Called to room to assist during endoscopic procedure.  Patient ID and intended procedure confirmed with present staff. Received instructions for my participation in the procedure from the performing physician.  

## 2023-01-29 NOTE — Progress Notes (Signed)
[facility] History and Physical   Primary Care Physician:  Richmond Campbell., PA-C   Reason for Procedure:  Epigastric pain  Plan:    EGD  with possible interventions as needed     HPI: Cristina [name] is a very pleasant 67 y.o. female here for EGD for evaluation of epigastric pain. H/ GERD Please refer to office visit by [facility] 12/15/22 for additional details  The risks and benefits as well as alternatives of endoscopic procedure(s) have been discussed and reviewed. All questions answered. The patient agrees to proceed.    Past Medical History:  Diagnosis Date   GERD (gastroesophageal reflux disease)    Pancreatitis    Pneumonia    Thyroid disease     Past Surgical History:  Procedure Laterality Date   COLON SURGERY  2016   twisted intestins surry done in [place]   COLONOSCOPY     done in [place]   STAPEDECTOMY  1997    Prior to Admission medications   Medication Sig Start Date End Date Taking? Authorizing Provider  alclomethasone (ACLOVATE) 0.05 % cream Apply 1 Application topically as needed. 11/01/21  Yes [provider]  Cholecalciferol (VITAMIN D3) 10 MCG (400 UNIT) tablet Take 400 Units by mouth daily.   Yes [provider]  levothyroxine (SYNTHROID) 75 MCG tablet Take 75 mcg by mouth daily.   Yes [provider]  Multiple Vitamin (MULTIVITAMIN) capsule Take 1 capsule by mouth daily.   Yes [provider]  Probiotic Product (ADVANCED PROBIOTIC-14) CAPS 1 capsule daily.   Yes [provider]  famotidine (PEPCID) 40 MG tablet Take 1 tablet (40 mg total) by mouth at bedtime. Patient not taking: Reported on 01/29/2023 12/15/22   Legrand Como, PA-C  sucralfate (CARAFATE) 1 g tablet Take 1 tablet (1 g total) by mouth 2 (two) times daily. 1 hour away from meals Patient not taking: Reported on 01/29/2023 12/15/22   Legrand Como, PA-C    Current Outpatient Medications  Medication Sig  Dispense Refill   alclomethasone (ACLOVATE) 0.05 % cream Apply 1 Application topically as needed.     Cholecalciferol (VITAMIN D3) 10 MCG (400 UNIT) tablet Take 400 Units by mouth daily.     levothyroxine (SYNTHROID) 75 MCG tablet Take 75 mcg by mouth daily.     Multiple Vitamin (MULTIVITAMIN) capsule Take 1 capsule by mouth daily.     Probiotic Product (ADVANCED PROBIOTIC-14) CAPS 1 capsule daily.     famotidine (PEPCID) 40 MG tablet Take 1 tablet (40 mg total) by mouth at bedtime. (Patient not taking: Reported on 01/29/2023) 30 tablet 1   sucralfate (CARAFATE) 1 g tablet Take 1 tablet (1 g total) by mouth 2 (two) times daily. 1 hour away from meals (Patient not taking: Reported on 01/29/2023) 60 tablet 1   Current Facility-Administered Medications  Medication Dose Route Frequency Provider Last Rate Last Admin   0.9 %  sodium chloride infusion  500 mL Intravenous Once Napoleon Form, MD        Allergies as of 01/29/2023 - Review Complete 01/29/2023  Allergen Reaction Noted   Epinephrine Anaphylaxis 06/23/2021   Lidocaine Anaphylaxis 06/23/2021    Family History  Problem Relation Age of Onset   Diabetes Mother    Kidney cancer Mother    Colon cancer Brother 15       did have part of colon removed   Kidney cancer Maternal Aunt    Diabetes Maternal Grandmother    Esophageal  cancer Neg Hx    Rectal cancer Neg Hx    Stomach cancer Neg Hx     Social History   Socioeconomic History   Marital status: Divorced    Spouse name: Not on file   Number of children: Not on file   Years of education: Not on file   Highest education level: Not on file  Occupational History   Not on file  Tobacco Use   Smoking status: Never   Smokeless tobacco: Never  Vaping Use   Vaping status: Never Used  Substance and Sexual Activity   Alcohol use: Not Currently   Drug use: Never   Sexual activity: Not on file  Other Topics Concern   Not on file  Social History Narrative   Not on file    Social Determinants of Health   Financial Resource Strain: Low Risk  (02/18/2019)   [facility] prior to 05/13/2022., [facility] visits prior to 05/13/2022.   Overall Financial Resource Strain (CARDIA)    Difficulty of Paying Living Expenses: Not hard at all  Food Insecurity: No Food Insecurity (02/18/2019)   Received from [facility] visits prior to 05/13/2022., [facility] visits prior to 05/13/2022.   Hunger Vital Sign    Worried About Running Out of Food in the Last Year: Never true    Ran Out of Food in the Last Year: Never true  Transportation Needs: No Transportation Needs (02/18/2019)   Received from [facility] visits prior to 05/13/2022., [facility] visits prior to 05/13/2022.   PRAPARE - Administrator, Civil Service (Medical): No    Lack of Transportation (Non-Medical): No  Physical Activity: Insufficiently Active (02/18/2019)   Received from [facility] visits prior to 05/13/2022., [facility] visits prior to 05/13/2022.   Exercise Vital Sign    Days of Exercise per Week: 4 days    Minutes of Exercise per Session: 30 min  Stress: Not on file  Social Connections: Unknown (02/18/2019)   [facility] visits prior to 05/13/2022., [facility] visits prior to 05/13/2022.   Social Advertising account executive [NHANES]    Frequency of Communication with Friends and Family: More than three times a week    Frequency of Social Gatherings with Friends and Family: Patient refused    Attends Religious Services: More than 4 times per year    Active Member of [employer] or Organizations: Yes    Attends Engineer, structural: More than 4 times per year    Marital Status: Patient refused  Intimate Partner Violence: Not on file    Review of  Systems:  All other review of systems negative except as mentioned in the HPI.  Physical Exam: Vital signs in last 24 hours: BP 119/70   Pulse 62   Temp (!) 97.2 F (36.2 C)   Resp (!) 27   Ht 5\' 5"  (1.651 m)   Wt 124 lb (56.2 kg)   SpO2 100%   BMI 20.63 kg/m  General:   Alert, NAD Lungs:  Clear .   Heart:  Regular rate and rhythm Abdomen:  Soft, nontender and nondistended. Neuro/Psych:  Alert and cooperative. Normal mood and affect. A and O x 3  Reviewed labs, radiology imaging, old records and pertinent past GI work up  Patient  is appropriate for planned procedure(s) and anesthesia in an ambulatory setting   K. Scherry Ran , MD [phone]

## 2023-01-29 NOTE — Op Note (Signed)
[facility] Patient Name: Cristina [name] Procedure Date: 01/29/2023 9:50 AM MRN: [id number] Endoscopist: Napoleon Form , MD, [id number] Age: 67 Referring MD:  Date of Birth: Jul 19, 1955 Gender: Female Account #: [id number] Procedure:                Upper GI endoscopy Indications:              Dysphagia, Epigastric abdominal pain, Suspected                            gastro-esophageal reflux disease Medicines:                Monitored Anesthesia Care Procedure:                Pre-Anesthesia Assessment:                           - Prior to the procedure, a History and Physical                            was performed, and patient medications and                            allergies were reviewed. The patient's tolerance of                            previous anesthesia was also reviewed. The risks                            and benefits of the procedure and the sedation                            options and risks were discussed with the patient.                            All questions were answered, and informed consent                            was obtained. Prior Anticoagulants: The patient has                            taken no anticoagulant or antiplatelet agents. ASA                            Grade Assessment: II - A patient with mild systemic                            disease. After reviewing the risks and benefits,                            the patient was deemed in satisfactory condition to                            undergo the procedure.  After obtaining informed consent, the endoscope was                            passed under direct vision. Throughout the                            procedure, the patient's blood pressure, pulse, and                            oxygen saturations were monitored continuously. The                            GIF [id number] #[id number] was introduced through the                            mouth, and advanced to  the second part of duodenum.                            The upper GI endoscopy was accomplished without                            difficulty. The patient tolerated the procedure                            well. Scope In: Scope Out: Findings:                 The Z-line was regular and was found 38 cm from the                            incisors.                           No gross lesions were noted in the entire esophagus.                           A 1 cm hiatal hernia was present.                           Patchy mild inflammation characterized by                            congestion (edema) and erythema was found in the                            entire examined stomach. Biopsies were taken with a                            cold forceps for Helicobacter pylori testing.                           The cardia and gastric fundus were normal on                            retroflexion.  The examined duodenum was normal. Complications:            No immediate complications. Estimated Blood Loss:     Estimated blood loss was minimal. Impression:               - Z-line regular, 38 cm from the incisors.                           - No gross lesions in the entire esophagus.                           - 1 cm hiatal hernia.                           - Gastritis. Biopsied.                           - Normal examined duodenum. Recommendation:           - Patient has a contact number available for                            emergencies. The signs and symptoms of potential                            delayed complications were discussed with the                            patient. Return to normal activities tomorrow.                            Written discharge instructions were provided to the                            patient.                           - Resume previous diet.                           - Continue present medications.                           - Await  pathology results.                           - Patient remains reluctant to undergo surveillance                            colonoscopy, declined scheduling it Napoleon Form, MD 01/29/2023 10:09:02 AM This report has been signed electronically.

## 2023-01-30 ENCOUNTER — Telehealth: Payer: Self-pay | Admitting: *Deleted

## 2023-01-30 NOTE — Telephone Encounter (Signed)
  Follow up Call-     01/29/2023    8:54 AM  Call back number  Post procedure Call Back phone  # [phone]  Permission to leave phone message Yes     Patient questions:  Do you have a fever, pain , or abdominal swelling? No. Pain Score  0 *  Have you tolerated food without any problems? Yes.    Have you been able to return to your normal activities? Yes.    Do you have any questions about your discharge instructions: Diet   No. Medications  No. Follow up visit  No.  Do you have questions or concerns about your Care? No.  Actions: * If pain score is 4 or above: No action needed, pain <4.

## 2023-01-31 LAB — SURGICAL PATHOLOGY

## 2023-02-05 ENCOUNTER — Encounter: Payer: Self-pay | Admitting: Gastroenterology

## 2023-02-13 NOTE — Telephone Encounter (Signed)
Inbound call from patient, following up on conversation below. Please advise.

## 2023-02-19 ENCOUNTER — Telehealth: Payer: Self-pay

## 2023-02-19 NOTE — Telephone Encounter (Signed)
Reason for call/chief complaint:  Continuing to have "sharp, knife like" pain upper abdomen beneath the sternum. The pain is worse when she has an empty stomach. She stopped famotidine due to the "extreme" dizziness she would develop with a dose. The dizziness have stopped since stopping the medication.   Advice/Recommendations:

## 2023-02-19 NOTE — Telephone Encounter (Signed)
Inbound call from patient, following up on call below. Patient is extremely anxious states she does not want to communicate through messages, would like to speak to someone directly.

## 2023-02-19 NOTE — Telephone Encounter (Signed)
Called patient, she has constant upper abdominal discomfort and intermittent nausea, is worse in between meals.  Mild gastritis on biopsies, negative for H. pylori or any precancerous changes.  Advised patient to take sucralfate 1 g as slurry twice daily for 1 week and then use as needed after that. Please send prescription for dicyclomine 10 mg every 8 hours as needed for abdominal cramping and discomfort Schedule HIDA scan to exclude gallbladder dysfunction Follow-up in GI office next available appointment either with me or APP after HIDA scan.  Thank you

## 2023-02-20 ENCOUNTER — Other Ambulatory Visit: Payer: Self-pay

## 2023-02-20 DIAGNOSIS — R1013 Epigastric pain: Secondary | ICD-10-CM

## 2023-02-20 DIAGNOSIS — K861 Other chronic pancreatitis: Secondary | ICD-10-CM

## 2023-02-20 DIAGNOSIS — R12 Heartburn: Secondary | ICD-10-CM

## 2023-02-20 MED ORDER — DICYCLOMINE HCL 10 MG PO CAPS: 10.0000 mg | ORAL_CAPSULE | Freq: Three times a day (TID) | ORAL | 0 refills | Status: AC | PRN

## 2023-02-20 NOTE — Telephone Encounter (Signed)
Orders entered. Radiology scheduling notified.

## 2023-02-21 ENCOUNTER — Encounter: Payer: Self-pay | Admitting: Gastroenterology

## 2023-03-01 ENCOUNTER — Encounter (HOSPITAL_COMMUNITY)
Admission: RE | Admit: 2023-03-01 | Discharge: 2023-03-01 | Disposition: A | Discharge status: Home / Self Care | Payer: Medicare Other | Source: Ambulatory Visit | Attending: Gastroenterology | Admitting: Gastroenterology

## 2023-03-01 DIAGNOSIS — K861 Other chronic pancreatitis: Secondary | ICD-10-CM | POA: Diagnosis present

## 2023-03-01 DIAGNOSIS — R1013 Epigastric pain: Secondary | ICD-10-CM

## 2023-03-01 DIAGNOSIS — R12 Heartburn: Secondary | ICD-10-CM

## 2023-03-01 MED ORDER — TECHNETIUM TC 99M MEBROFENIN IV KIT
5.3100 | PACK | Freq: Once | INTRAVENOUS | Status: AC | PRN
  Administered 2023-03-01: 5.31 via INTRAVENOUS

## 2023-03-08 ENCOUNTER — Encounter: Payer: Self-pay | Admitting: Gastroenterology

## 2023-04-11 ENCOUNTER — Encounter: Payer: Self-pay | Admitting: Gastroenterology

## 2023-04-11 ENCOUNTER — Ambulatory Visit (INDEPENDENT_AMBULATORY_CARE_PROVIDER_SITE_OTHER): Payer: Medicare Other | Admitting: Gastroenterology

## 2023-04-11 VITALS — BP 90/60 | HR 82 | Ht 65.0 in | Wt 123.2 lb

## 2023-04-11 DIAGNOSIS — K2950 Unspecified chronic gastritis without bleeding: Secondary | ICD-10-CM | POA: Diagnosis not present

## 2023-04-11 DIAGNOSIS — E559 Vitamin D deficiency, unspecified: Secondary | ICD-10-CM | POA: Diagnosis not present

## 2023-04-11 DIAGNOSIS — R1013 Epigastric pain: Secondary | ICD-10-CM

## 2023-04-11 DIAGNOSIS — Z800 Family history of malignant neoplasm of digestive organs: Secondary | ICD-10-CM

## 2023-04-11 DIAGNOSIS — K2930 Chronic superficial gastritis without bleeding: Secondary | ICD-10-CM

## 2023-04-11 DIAGNOSIS — K5900 Constipation, unspecified: Secondary | ICD-10-CM

## 2023-04-11 DIAGNOSIS — R634 Abnormal weight loss: Secondary | ICD-10-CM | POA: Diagnosis not present

## 2023-04-11 DIAGNOSIS — Z8719 Personal history of other diseases of the digestive system: Secondary | ICD-10-CM

## 2023-04-11 MED ORDER — PANTOPRAZOLE SODIUM 20 MG PO TBEC: 20.0000 mg | DELAYED_RELEASE_TABLET | Freq: Two times a day (BID) | ORAL | 3 refills | Status: AC

## 2023-04-11 NOTE — Patient Instructions (Addendum)
History of Present Illness The patient presents with upper abdominal pain exacerbated by certain foods.  She experiences upper abdominal pain that worsens after consuming heavy or rich foods, such as beef or bread. The pain can become severe within half an hour of eating such meals. She manages the pain by eating small, frequent meals and avoiding heavy foods. If she does not eat for about two hours, the pain returns, prompting her to eat something to alleviate it. No issues with bowel habits, but she notes feeling bloated and sometimes experiences pain under the rib. No stress related to her condition.  She has been using sucralfate for her symptoms but cannot tolerate famotidine due to severe dizziness. She tried famotidine for two days and experienced dizziness, which resolved upon discontinuation. She also reports constipation with famotidine, which led her to stop using it despite some initial relief of her stomach symptoms.  She recalls an episode in April when she was hospitalized for pancreatitis, with levels over 300, and a milder recurrence in September. Since September, she has not felt well, experiencing persistent stomach issues. She has not had any prior stomach problems before these episodes.  Her family history includes colon cancer, with her brother having had the disease. She has experienced weight loss since April, dropping from 137 pounds over the past six months.  Physical Exam MEASUREMENTS: WT- 137 pounds ABDOMEN: Mild bloating in the epigastric and right upper quadrant regions.  Results LABS Pancreatic enzymes: over 300 (April 2024)  RADIOLOGY Biliary scan: normal (December 2024) CT scan: normal  DIAGNOSTIC Endoscopy: gastritis  Assessment and Plan Gastritis Chronic gastritis exacerbated by rich foods. Symptoms persist despite famotidine due to dizziness and constipation. Sucralfate was difficult to manage with levothyroxine. No H. pylori infection. Symptoms ongoing  since pancreatitis in April, with a milder recurrence in September. Discussed Protonix or Prilosec as alternative PPIs. Emphasized balancing disease management with potential side effects. Recommended a 64-month trial of Protonix to reduce acid and heal the stomach lining, with a taper plan to avoid rebound acid. Discussed potential long-term PPI side effects, noting relevance for long-term use (10-20 years) rather than short-term (2-3 months). - Prescribe Protonix 20mg  before lunch and dinner for 2-3 months - Use sucralfate as needed, ensuring it is taken an hour after levothyroxine and before meals - Continue small frequent meals and avoid greasy or heavy foods - Increase water intake - Provide samples of Ibgard (peppermint oil capsules) to be taken up to three times a day as needed  Weight Loss Unintentional weight loss from 137 pounds over six months, potentially related to chronic gastritis and dietary changes. Family history of colon cancer. Strongly recommended colonoscopy due to family history and significant weight loss. Explained importance of procedure despite previous negative experience. Colonoscopies recommended every five years with family history; last one was over 9 years ago. - Strongly recommend colonoscopy due to family history of colon cancer and significant weight loss - Discuss importance of colonoscopy despite reluctance due to previous negative experience  General Health Maintenance Vitamin D deficiency managed with supplements. Emphasized importance of continuing current supplements. - Continue vitamin D supplementation - Encourage regular exercise and hydration  Follow-up - Follow up in 2-3 months to reassess gastritis and medication efficacy - Call the office if there is a change of mind regarding the colonoscopy.  It has been recommended to you by your physician that you have a(n) Colonoscopy completed. Per your request, we did not schedule the procedure(s) today.  Please contact  our office at [phone] should you decide to have the procedure completed. You will be scheduled for a pre-visit and procedure at that time.  Take IBGard three times a day as needed  Eat small frequent meals   I appreciate the  opportunity to care for you  Thank You   Marsa Aris , MD

## 2023-04-11 NOTE — Progress Notes (Signed)
Cristina [name]    [id number]    06-12-1955  Primary Care Physician:Kaplan, Isidor Holts., PA-C  Referring Physician: Richmond Campbell., PA-C [address]  [place] [address]   Chief complaint:  Abdominal pain  Discussed the use of AI scribe software for clinical note transcription with the patient, who gave verbal consent to proceed.  History of Present Illness   68 year old F very pleasant patient presents with upper abdominal pain exacerbated by certain foods.  She experiences upper abdominal pain that worsens after consuming heavy or rich foods, such as beef or bread. The pain can become severe within half an hour of eating such meals. She manages the pain by eating small, frequent meals and avoiding heavy foods. If she does not eat for about two hours, the pain returns, prompting her to eat something to alleviate it. No issues with bowel habits, but she notes feeling bloated and sometimes experiences pain under the rib. No stress related to her condition.  She has been using sucralfate for her symptoms but cannot tolerate famotidine due to severe dizziness. She tried famotidine for two days and experienced dizziness, which resolved upon discontinuation. She also reports constipation with famotidine, which led her to stop using it despite some initial relief of her stomach symptoms.  She recalls an episode in April when she was hospitalized for pancreatitis, with levels over 300, and a milder recurrence in September. Since September, she has not felt well, experiencing persistent stomach issues. She has not had any prior stomach problems before these episodes.  Her family history includes colon cancer, with her brother having had the disease. She has experienced weight loss since April, dropping from 137 pounds over the past six months.      GI history In April 2023 developed severe LUQ abd pain, was seen in ER for acute pancreatitis, diagnosed with idiopathic  pancreatitis  Lipase 06/23/21: 53  CT abd & pelvis with contrast 06/23/2021 Minimal strandy edema in the left upper quadrant as above, nonspecific for mild acute distal pancreatitis, colitis or mild diverticulitis of the splenic flexure of the colon, or localized left upper quadrant epiploic appendagitis. No other complicating feature including perforation, obstruction, fluid collection, or abscess.  CT abdomen pelvis with contrast December 10, 2022: No acute findings, no pancreatic or peripancreatic inflammation  EGD 01/29/23 - Z- line regular, 38 cm from the incisors. - No gross lesions in the entire esophagus. - 1 cm hiatal hernia. - Gastritis. Biopsied. - Normal examined duodenum.  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 There is an unchanged, partially exophytic macroscopic fat containing mass of the peripheral superior pole of the right kidney measuring 1.1 x 1.0 cm, not significantly changed compared to prior examinations. This is consistent with a benign angiomyolipoma. No further routine follow-up is required. 2. 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  Findings are consistent with pelvic congestion if clinically referable signs and symptoms are present.         Outpatient Encounter Medications as of 04/11/2023  Medication Sig   alclomethasone (ACLOVATE) 0.05 % cream Apply 1 Application topically as needed.   Cholecalciferol (VITAMIN  D3) 10 MCG (400 UNIT) tablet Take 400 Units by mouth daily.   levothyroxine (SYNTHROID) 75 MCG tablet Take 75 mcg by mouth daily.   Multiple Vitamin (MULTIVITAMIN) capsule Take 1 capsule by mouth daily.   pantoprazole (PROTONIX) 20 MG tablet Take 1 tablet (20 mg total) by mouth 2 (two) times daily.   Probiotic Product (ADVANCED PROBIOTIC-14) CAPS 1 capsule daily.   dicyclomine (BENTYL) 10 MG capsule Take 1 capsule (10 mg total) by mouth every 8 (eight) hours as needed for spasms. (Patient not taking: Reported on 04/11/2023)   famotidine (PEPCID) 20 MG tablet Take 2 tablets (40 mg total) by mouth at bedtime. Take 40mg  daily as needed at bedtime (Patient not taking: Reported on 04/11/2023)   famotidine (PEPCID) 40 MG tablet Take 1 tablet (40 mg total) by mouth at bedtime. (Patient not taking: Reported on 04/11/2023)   sucralfate (CARAFATE) 1 g tablet Take 1 tablet (1 g total) by mouth 2 (two) times daily. 1 hour away from meals (Patient not taking: Reported on 04/11/2023)   Facility-Administered Encounter Medications as of 04/11/2023  Medication   0.9 %  sodium chloride infusion    Allergies as of 04/11/2023 - Review Complete 04/11/2023  Allergen Reaction Noted   Epinephrine Anaphylaxis 06/23/2021   Lidocaine Anaphylaxis 06/23/2021    Past Medical History:  Diagnosis Date   GERD (gastroesophageal reflux disease)    Pancreatitis    Pneumonia    Thyroid disease     Past Surgical History:  Procedure Laterality Date   COLON SURGERY  2016   twisted intestins surry done in [place]   COLONOSCOPY     done in [place]   STAPEDECTOMY  1997    Family History  Problem Relation Age of Onset   Diabetes Mother    Kidney cancer Mother    Colon cancer Brother 70       did have part of colon removed   Kidney cancer Maternal Aunt    Diabetes Maternal Grandmother    Esophageal cancer Neg Hx    Rectal cancer Neg Hx    Stomach cancer Neg Hx     Social History   Socioeconomic History    Marital status: Divorced    Spouse name: Not on file   Number of children: Not on file   Years of education: Not on file   Highest education level: Not on file  Occupational History   Not on file  Tobacco Use   Smoking status: Never   Smokeless tobacco: Never  Vaping Use   Vaping status: Never Used  Substance and Sexual Activity   Alcohol use: Not Currently   Drug use: Never   Sexual activity: Not on file  Other Topics Concern   Not on file  Social History Narrative   Not on file   Social Drivers of Health   Financial Resource Strain: Low Risk  (02/18/2019)   [facility] prior to 05/13/2022., [facility] visits prior to 05/13/2022.   Overall Financial Resource Strain (CARDIA)    Difficulty of Paying Living Expenses: Not hard at all  Food Insecurity: No Food Insecurity (02/18/2019)   [facility] prior to 05/13/2022., [facility] visits prior to 05/13/2022.   Hunger Vital Sign    Worried About Running Out of Food in the Last Year: Never true    Ran Out of Food in the Last Year: Never true  Transportation Needs: No Transportation Needs (02/18/2019)   Received from [facility] visits prior to 05/13/2022., [facility] visits prior to 05/13/2022.   PRAPARE - Administrator, Civil Service (Medical): No    Lack of Transportation (Non-Medical): No  Physical Activity: Insufficiently Active (02/18/2019)   [facility] from [facility] visits prior to 05/13/2022., [facility] visits prior to 05/13/2022.   Exercise Vital Sign    Days of Exercise per Week: 4 days    Minutes of Exercise per Session: 30 min  Stress: Not on file  Social Connections: Unknown (02/18/2019)   [facility] prior to 05/13/2022., [facility] visits  prior to 05/13/2022.   Social Advertising account executive [NHANES]    Frequency of Communication with Friends and Family: More than three times a week    Frequency of Social Gatherings with Friends and Family: Patient refused    Attends Religious Services: More than 4 times per year    Active Member of [employer] or Organizations: Yes    Attends Engineer, structural: More than 4 times per year    Marital Status: Patient refused  Intimate Partner Violence: Not on file      Review of systems: All other review of systems negative except as mentioned in the HPI.   Physical Exam: Vitals:   04/11/23 1015  BP: 90/60  Pulse: 82   Body mass index is 20.5 kg/m. Gen:      No acute distress HEENT:  sclera anicteric CV: s1s2 rrr, no murmur Lungs: B/l clear. Abd:      soft, non-tender; no palpable masses, no distension Ext:    No edema Neuro: alert and oriented x 3 Psych: normal mood and affect  Data Reviewed:  Reviewed labs, radiology imaging, old records and pertinent past GI work up     Assessment and Plan    Gastritis Chronic gastritis exacerbated by rich foods. Symptoms persist, did not take famotidine due to dizziness and sucralfate exacerbated constipation. Sucralfate was difficult to manage with levothyroxine. No H. pylori infection. Symptoms ongoing since pancreatitis in April, with a milder recurrence in September. Discussed Protonix or Prilosec as alternative PPIs. Emphasized balancing disease management with potential side effects. Recommended a 63-month trial of Protonix to reduce acid and heal the stomach lining, with a taper plan to avoid rebound acid. Discussed potential long-term PPI side effects, noting relevance for long-term use (10-20 years) rather than short-term (2-3 months). - Prescribe Protonix 20mg  before lunch and dinner for 2-3 months - Use sucralfate as needed, ensuring it is taken an hour after levothyroxine and before meals - Continue small frequent  meals and avoid greasy or heavy foods - Increase water intake - Provide samples of Ibgard (peppermint oil capsules) to be taken up to three times a day as needed for symptom management  Weight Loss Unintentional weight loss from 137 pounds over six months, potentially related to chronic gastritis and dietary changes. Family history of colon cancer. Strongly recommended colonoscopy due to family history and significant weight loss. Explained importance of procedure despite previous negative experience.  Colonoscopies recommended every five years with family history; last one was over 9 years ago. - Strongly recommend colonoscopy due to family history of colon cancer and significant weight loss - Discuss importance of colonoscopy despite reluctance due to previous negative experience  History of idiopathic pancreatitis: Follow-up imaging CT abdomen pelvis negative for any pancreatic inflammation in September 2024, negative for any pancreatic lesions HIDA scan negative for cystic duct or CBD obstruction, normal study  General Health Maintenance Vitamin D deficiency managed with supplements. Emphasized importance of continuing current supplements. - Continue vitamin D supplementation - Encourage regular exercise and hydration  Follow-up - Follow up in 2-3 months to reassess gastritis and medication efficacy - Call the office if there is a change of mind regarding the colonoscopy.        This visit required >30 minutes of patient care (this includes precharting, chart review, review of results, face-to-face time used for counseling as well as treatment plan and follow-up. The patient was provided an opportunity to ask questions and all were answered. The patient agreed with the plan and demonstrated an understanding of the instructions.  Iona Beard , MD    CC: Richmond Campbell., PA-C

## 2023-04-19 ENCOUNTER — Encounter: Payer: Self-pay | Admitting: Gastroenterology

## 2023-09-24 ENCOUNTER — Other Ambulatory Visit: Payer: Self-pay | Admitting: Gastroenterology

## 2023-12-16 LAB — COLOGUARD: COLOGUARD: NEGATIVE

## 2023-12-18 ENCOUNTER — Encounter: Payer: Self-pay | Admitting: Gastroenterology

## 2024-02-28 ENCOUNTER — Other Ambulatory Visit (HOSPITAL_COMMUNITY): Payer: Self-pay
